# Patient Record
Sex: Male | Born: 1955
Health system: Southern US, Community
[De-identification: ages and names within clinical notes are randomized; demographics above are authoritative.]

## PROBLEM LIST (undated history)

## (undated) DIAGNOSIS — E785 Hyperlipidemia, unspecified: Secondary | ICD-10-CM

## (undated) DIAGNOSIS — F419 Anxiety disorder, unspecified: Secondary | ICD-10-CM

## (undated) DIAGNOSIS — R002 Palpitations: Secondary | ICD-10-CM

## (undated) DIAGNOSIS — L600 Ingrowing nail: Secondary | ICD-10-CM

## (undated) DIAGNOSIS — K645 Perianal venous thrombosis: Secondary | ICD-10-CM

## (undated) DIAGNOSIS — I420 Dilated cardiomyopathy: Secondary | ICD-10-CM

## (undated) DIAGNOSIS — I493 Ventricular premature depolarization: Secondary | ICD-10-CM

## (undated) DIAGNOSIS — R011 Cardiac murmur, unspecified: Secondary | ICD-10-CM

## (undated) DIAGNOSIS — I340 Nonrheumatic mitral (valve) insufficiency: Secondary | ICD-10-CM

## (undated) DIAGNOSIS — N529 Male erectile dysfunction, unspecified: Secondary | ICD-10-CM

## (undated) DIAGNOSIS — I429 Cardiomyopathy, unspecified: Secondary | ICD-10-CM

## (undated) DIAGNOSIS — L603 Nail dystrophy: Secondary | ICD-10-CM

## (undated) DIAGNOSIS — I10 Essential (primary) hypertension: Secondary | ICD-10-CM

## (undated) DIAGNOSIS — F410 Panic disorder [episodic paroxysmal anxiety] without agoraphobia: Secondary | ICD-10-CM

## (undated) DIAGNOSIS — E291 Testicular hypofunction: Secondary | ICD-10-CM

## (undated) DIAGNOSIS — G2581 Restless legs syndrome: Secondary | ICD-10-CM

## (undated) HISTORY — DX: Nonrheumatic mitral (valve) insufficiency: I34.0

## (undated) HISTORY — DX: Ventricular premature depolarization: I49.3

## (undated) HISTORY — DX: Nail dystrophy: L60.3

## (undated) HISTORY — DX: Cardiac murmur, unspecified: R01.1

## (undated) HISTORY — DX: Testicular hypofunction: E29.1

## (undated) HISTORY — DX: Restless legs syndrome: G25.81

## (undated) HISTORY — DX: Ingrowing nail: L60.0

## (undated) HISTORY — DX: Cardiomyopathy, unspecified: I42.9

## (undated) HISTORY — PX: COLONOSCOPY: SHX174

## (undated) HISTORY — PX: POLYPECTOMY: SHX149

## (undated) HISTORY — DX: Panic disorder (episodic paroxysmal anxiety): F41.0

## (undated) HISTORY — DX: Hyperlipidemia, unspecified: E78.5

## (undated) HISTORY — PX: HERNIA REPAIR: SHX51

## (undated) HISTORY — DX: Palpitations: R00.2

## (undated) HISTORY — DX: Male erectile dysfunction, unspecified: N52.9

## (undated) HISTORY — DX: Anxiety disorder, unspecified: F41.9

## (undated) HISTORY — DX: Dilated cardiomyopathy: I42.0

## (undated) HISTORY — DX: Essential (primary) hypertension: I10

## (undated) HISTORY — DX: Perianal venous thrombosis: K64.5

---

## 2004-02-29 ENCOUNTER — Emergency Department (HOSPITAL_COMMUNITY): Admission: EM | Admit: 2004-02-29 | Discharge: 2004-02-29 | Payer: Self-pay | Admitting: Emergency Medicine

## 2005-08-06 ENCOUNTER — Ambulatory Visit: Payer: Self-pay | Admitting: Oncology

## 2005-10-29 ENCOUNTER — Ambulatory Visit: Payer: Self-pay | Admitting: Oncology

## 2006-01-21 ENCOUNTER — Ambulatory Visit: Payer: Self-pay | Admitting: Oncology

## 2006-07-15 ENCOUNTER — Ambulatory Visit: Payer: Self-pay | Admitting: Oncology

## 2012-10-26 ENCOUNTER — Emergency Department (HOSPITAL_COMMUNITY): Payer: Managed Care, Other (non HMO)

## 2012-10-26 ENCOUNTER — Encounter (HOSPITAL_COMMUNITY): Payer: Self-pay | Admitting: Emergency Medicine

## 2012-10-26 ENCOUNTER — Emergency Department (HOSPITAL_COMMUNITY)
Admission: EM | Admit: 2012-10-26 | Discharge: 2012-10-27 | Disposition: A | Discharge status: Home / Self Care | Payer: Managed Care, Other (non HMO) | Attending: Emergency Medicine | Admitting: Emergency Medicine

## 2012-10-26 DIAGNOSIS — R066 Hiccough: Secondary | ICD-10-CM

## 2012-10-26 DIAGNOSIS — Z79899 Other long term (current) drug therapy: Secondary | ICD-10-CM | POA: Insufficient documentation

## 2012-10-26 DIAGNOSIS — E785 Hyperlipidemia, unspecified: Secondary | ICD-10-CM | POA: Insufficient documentation

## 2012-10-26 DIAGNOSIS — J3489 Other specified disorders of nose and nasal sinuses: Secondary | ICD-10-CM | POA: Insufficient documentation

## 2012-10-26 DIAGNOSIS — Z792 Long term (current) use of antibiotics: Secondary | ICD-10-CM | POA: Insufficient documentation

## 2012-10-26 DIAGNOSIS — R0602 Shortness of breath: Secondary | ICD-10-CM | POA: Insufficient documentation

## 2012-10-26 HISTORY — DX: Hyperlipidemia, unspecified: E78.5

## 2012-10-26 MED ORDER — METOCLOPRAMIDE HCL 5 MG/ML IJ SOLN
10.0000 mg | Freq: Once | INTRAMUSCULAR | Status: AC
  Administered 2012-10-27: 10 mg via INTRAVENOUS
  Filled 2012-10-26: qty 2

## 2012-10-26 MED ORDER — GI COCKTAIL ~~LOC~~
30.0000 mL | Freq: Once | ORAL | Status: AC
  Administered 2012-10-26: 30 mL via ORAL
  Filled 2012-10-26: qty 30

## 2012-10-26 MED ORDER — BACLOFEN 10 MG PO TABS
10.0000 mg | ORAL_TABLET | ORAL | Status: AC
  Administered 2012-10-26: 10 mg via ORAL
  Filled 2012-10-26: qty 1

## 2012-10-26 MED ORDER — LORAZEPAM 2 MG/ML IJ SOLN
1.0000 mg | Freq: Once | INTRAMUSCULAR | Status: AC
  Administered 2012-10-27: 1 mg via INTRAVENOUS
  Filled 2012-10-26: qty 1

## 2012-10-26 NOTE — ED Provider Notes (Signed)
CSN: [id number]     Arrival date & time 10/26/12  2011 History   First MD Initiated Contact with Patient 10/26/12 2126     Chief Complaint  Patient presents with  . Hiccups   (Consider location/radiation/quality/duration/timing/severity/associated sxs/prior Treatment) HPI Comments: Spontaneously began having hiccups  Patient is a 57 y.o. male presenting with general illness. The history is provided by the patient.  Illness Location:  Home Quality:  Severe Severity:  Severe Onset quality:  Sudden Duration:  5 days Timing:  Constant Progression:  Worsening Context:  Spontaneously Relieved by:  Nothing Worsened by:  Nothing Ineffective treatments:  Thorazine Associated symptoms: congestion and shortness of breath (with severe hiccups)   Associated symptoms: no cough, no fever and no vomiting     Past Medical History  Diagnosis Date  . Hyperlipemia    History reviewed. No pertinent past surgical history. No family history on file. History  Substance Use Topics  . Smoking status: Not on file  . Smokeless tobacco: Not on file  . Alcohol Use: Yes    Review of Systems  Constitutional: Negative for fever.  HENT: Positive for congestion.   Respiratory: Positive for shortness of breath (with severe hiccups). Negative for cough.   Gastrointestinal: Negative for vomiting.  All other systems reviewed and are negative.    Allergies  Shellfish allergy  Home Medications   Current Outpatient Rx  Name  Route  Sig  Dispense  Refill  . Alum & Mag Hydroxide-Simeth (MAGIC MOUTHWASH) SOLN   Oral   Take 5 mLs by mouth 4 (four) times daily as needed (for sore throat).         Marland Kitchen amoxicillin (AMOXIL) 500 MG capsule   Oral   Take 500 mg by mouth 3 (three) times daily.          . promethazine-codeine (PHENERGAN WITH CODEINE) 6.25-10 MG/5ML syrup   Oral   Take 5 mLs by mouth every 6 (six) hours as needed for cough.          . simvastatin (ZOCOR) 20 MG tablet   Oral  Take 20 mg by mouth every evening.         . testosterone (ANDROGEL) 50 MG/5GM GEL   Transdermal   Place 5 g onto the skin daily.          BP 138/81  Pulse 81  Temp(Src) 97.3 F (36.3 C) (Oral)  Resp 18  SpO2 98% Physical Exam  Nursing note and vitals reviewed. Constitutional: He is oriented to person, place, and time. He appears well-developed and well-nourished. No distress.  HENT:  Head: Normocephalic and atraumatic.  Mouth/Throat: No oropharyngeal exudate.  Eyes: EOM are normal. Pupils are equal, round, and reactive to light.  Neck: Normal range of motion. Neck supple.  Cardiovascular: Normal rate and regular rhythm.  Exam reveals no friction rub.   No murmur heard. Pulmonary/Chest: Effort normal and breath sounds normal. No respiratory distress. He has no wheezes. He has no rales.  Abdominal: He exhibits no distension. There is no tenderness. There is no rebound.  Musculoskeletal: Normal range of motion. He exhibits no edema.  Neurological: He is alert and oriented to person, place, and time. No cranial nerve deficit. He exhibits normal muscle tone. Coordination normal.  Hiccupping frequently with multiple hiccups in a row causing difficulty breathing  Skin: No rash noted. He is not diaphoretic.    ED Course  Procedures (including critical care time) Labs Review Labs Reviewed  CBC  BASIC METABOLIC  PANEL   Imaging Review No results found.  EKG Interpretation   None       MDM   1. Hiccups    Intractable hiccups for past 5 days. Took thorazine prescribed by his doctor today without relief. Baclofen given here. Baclofen with transient relief. Will check labs, CXR, give IV reglan and benzos.  Labs with mild hyponatremia, no other abnormalities. After reglan and ativan, sleeping comfortably. Hiccups resolved. Given baclofen to go home.   Dagmar Hait, MD 10/27/12 [phone]

## 2012-10-26 NOTE — ED Notes (Signed)
Per Patient: Patient states he has had constant hiccups for 5-6 days. Pt reports he has been prescribed Thorazine by his pcp without relief. States he first took the Thorazine around 1700-1800 today without relief. Pt is Ax4, NAD. Reports he is currently finishing up antibiotics for URI.

## 2012-10-26 NOTE — ED Notes (Signed)
Pt c/o hiccups for the past 5 days

## 2012-10-27 LAB — BASIC METABOLIC PANEL
BUN: 11 mg/dL (ref 6–23)
CO2: 30 mEq/L (ref 19–32)
Calcium: 9.2 mg/dL (ref 8.4–10.5)
Chloride: 92 mEq/L — ABNORMAL LOW (ref 96–112)
GFR calc Af Amer: >90 mL/min (ref >90)
GFR calc non Af Amer: >90 mL/min (ref >90)
Glucose, Bld: 132 mg/dL — ABNORMAL HIGH (ref 70–99)

## 2012-10-27 LAB — CBC
HCT: 45.9 % (ref 39.0–52.0)
RBC: 4.93 MIL/uL (ref 4.22–5.81)
RDW: 12.2 % (ref 11.5–15.5)

## 2012-10-27 MED ORDER — BACLOFEN 10 MG PO TABS: 10.0000 mg | ORAL_TABLET | Freq: Three times a day (TID) | ORAL | Status: DC | PRN

## 2014-09-26 IMAGING — CR DG CHEST 2V
2 series · 2 of 2 positions shown · non-contrast
Comparison: 02/29/2004

CLINICAL DATA: Hiccups

EXAM:
CHEST  2 VIEW

[w chest pa]
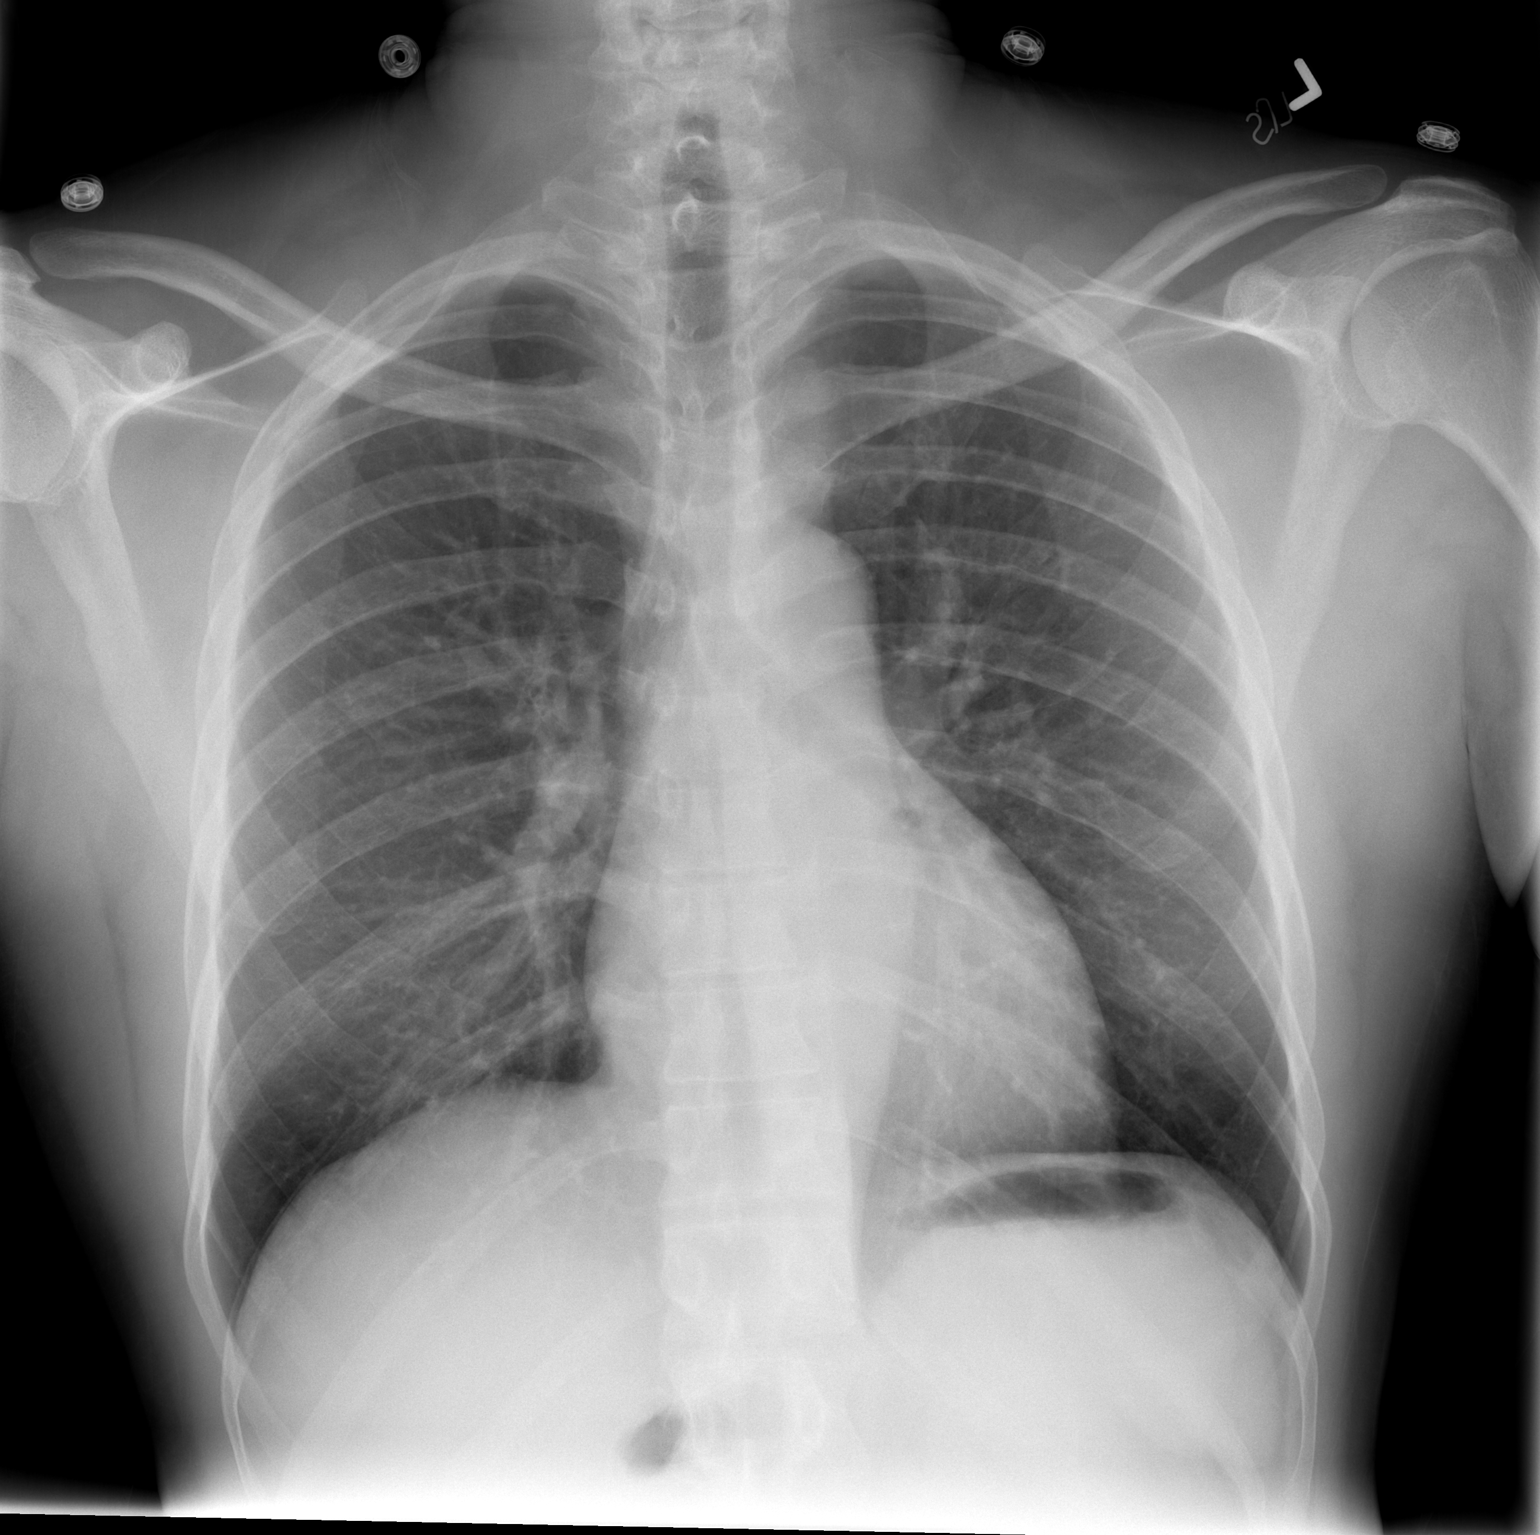

[w chest lat]
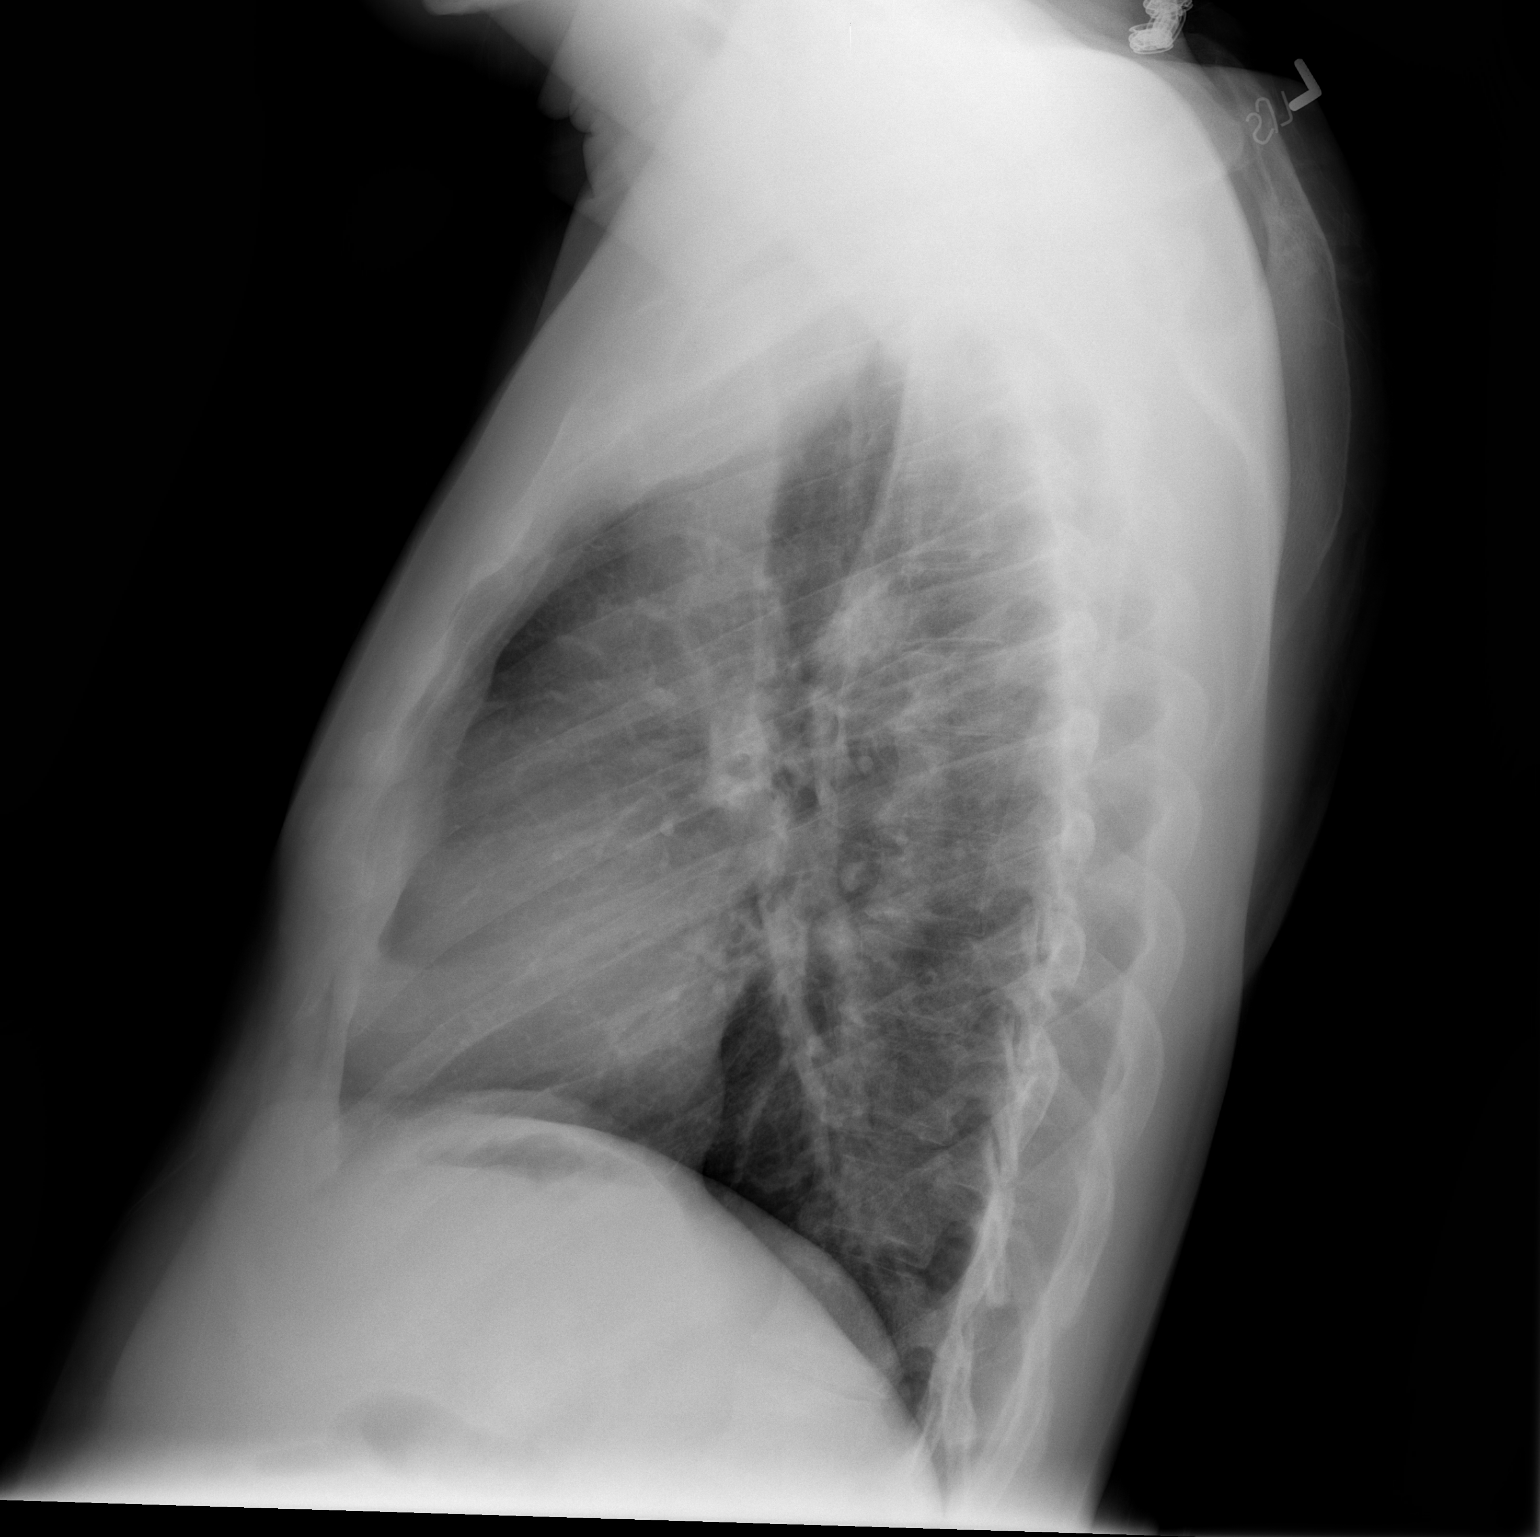

[2 of 2 positions shown; findings below may reference images not displayed]

FINDINGS: The heart size and mediastinal contours are within normal limits.
Both lungs are clear. The visualized skeletal structures are
unremarkable.
IMPRESSION: No active cardiopulmonary disease.

## 2015-11-10 DIAGNOSIS — I5042 Chronic combined systolic (congestive) and diastolic (congestive) heart failure: Secondary | ICD-10-CM | POA: Diagnosis not present

## 2015-11-10 DIAGNOSIS — I420 Dilated cardiomyopathy: Secondary | ICD-10-CM | POA: Diagnosis not present

## 2015-11-10 DIAGNOSIS — Z6823 Body mass index (BMI) 23.0-23.9, adult: Secondary | ICD-10-CM | POA: Diagnosis not present

## 2015-11-10 DIAGNOSIS — R0609 Other forms of dyspnea: Secondary | ICD-10-CM | POA: Diagnosis not present

## 2015-12-13 DIAGNOSIS — I420 Dilated cardiomyopathy: Secondary | ICD-10-CM | POA: Diagnosis not present

## 2015-12-13 DIAGNOSIS — E349 Endocrine disorder, unspecified: Secondary | ICD-10-CM | POA: Diagnosis not present

## 2015-12-13 DIAGNOSIS — Z23 Encounter for immunization: Secondary | ICD-10-CM | POA: Diagnosis not present

## 2015-12-13 DIAGNOSIS — E785 Hyperlipidemia, unspecified: Secondary | ICD-10-CM | POA: Diagnosis not present

## 2015-12-13 DIAGNOSIS — R0600 Dyspnea, unspecified: Secondary | ICD-10-CM | POA: Diagnosis not present

## 2015-12-13 DIAGNOSIS — Z0000 Encounter for general adult medical examination without abnormal findings: Secondary | ICD-10-CM | POA: Diagnosis not present

## 2015-12-31 DIAGNOSIS — L600 Ingrowing nail: Secondary | ICD-10-CM

## 2015-12-31 DIAGNOSIS — L603 Nail dystrophy: Secondary | ICD-10-CM

## 2015-12-31 HISTORY — DX: Nail dystrophy: L60.3

## 2015-12-31 HISTORY — DX: Ingrowing nail: L60.0

## 2016-01-26 DIAGNOSIS — J019 Acute sinusitis, unspecified: Secondary | ICD-10-CM | POA: Diagnosis not present

## 2016-01-26 DIAGNOSIS — Z6824 Body mass index (BMI) 24.0-24.9, adult: Secondary | ICD-10-CM | POA: Diagnosis not present

## 2016-02-09 DIAGNOSIS — J019 Acute sinusitis, unspecified: Secondary | ICD-10-CM | POA: Diagnosis not present

## 2016-02-09 DIAGNOSIS — R509 Fever, unspecified: Secondary | ICD-10-CM | POA: Diagnosis not present

## 2016-02-19 DIAGNOSIS — J019 Acute sinusitis, unspecified: Secondary | ICD-10-CM | POA: Diagnosis not present

## 2016-02-19 DIAGNOSIS — Z6823 Body mass index (BMI) 23.0-23.9, adult: Secondary | ICD-10-CM | POA: Diagnosis not present

## 2016-02-19 DIAGNOSIS — K1120 Sialoadenitis, unspecified: Secondary | ICD-10-CM | POA: Diagnosis not present

## 2016-03-13 DIAGNOSIS — E349 Endocrine disorder, unspecified: Secondary | ICD-10-CM | POA: Diagnosis not present

## 2016-03-13 DIAGNOSIS — E785 Hyperlipidemia, unspecified: Secondary | ICD-10-CM | POA: Diagnosis not present

## 2016-03-13 DIAGNOSIS — I420 Dilated cardiomyopathy: Secondary | ICD-10-CM | POA: Diagnosis not present

## 2016-03-13 DIAGNOSIS — N529 Male erectile dysfunction, unspecified: Secondary | ICD-10-CM | POA: Diagnosis not present

## 2016-06-19 DIAGNOSIS — E291 Testicular hypofunction: Secondary | ICD-10-CM | POA: Diagnosis not present

## 2016-06-19 DIAGNOSIS — I420 Dilated cardiomyopathy: Secondary | ICD-10-CM | POA: Diagnosis not present

## 2016-06-19 DIAGNOSIS — N529 Male erectile dysfunction, unspecified: Secondary | ICD-10-CM | POA: Diagnosis not present

## 2016-06-19 DIAGNOSIS — E785 Hyperlipidemia, unspecified: Secondary | ICD-10-CM | POA: Diagnosis not present

## 2016-06-19 DIAGNOSIS — Z1389 Encounter for screening for other disorder: Secondary | ICD-10-CM | POA: Diagnosis not present

## 2016-06-28 DIAGNOSIS — J019 Acute sinusitis, unspecified: Secondary | ICD-10-CM | POA: Diagnosis not present

## 2016-06-28 DIAGNOSIS — Z6824 Body mass index (BMI) 24.0-24.9, adult: Secondary | ICD-10-CM | POA: Diagnosis not present

## 2016-09-25 DIAGNOSIS — Z1389 Encounter for screening for other disorder: Secondary | ICD-10-CM | POA: Diagnosis not present

## 2016-09-25 DIAGNOSIS — I420 Dilated cardiomyopathy: Secondary | ICD-10-CM | POA: Diagnosis not present

## 2016-09-25 DIAGNOSIS — N529 Male erectile dysfunction, unspecified: Secondary | ICD-10-CM | POA: Diagnosis not present

## 2016-09-25 DIAGNOSIS — E291 Testicular hypofunction: Secondary | ICD-10-CM | POA: Diagnosis not present

## 2016-09-25 DIAGNOSIS — E785 Hyperlipidemia, unspecified: Secondary | ICD-10-CM | POA: Diagnosis not present

## 2016-09-28 DIAGNOSIS — I429 Cardiomyopathy, unspecified: Secondary | ICD-10-CM

## 2016-09-28 DIAGNOSIS — I420 Dilated cardiomyopathy: Secondary | ICD-10-CM | POA: Insufficient documentation

## 2016-09-28 HISTORY — DX: Dilated cardiomyopathy: I42.0

## 2016-09-28 HISTORY — DX: Cardiomyopathy, unspecified: I42.9

## 2016-09-30 DIAGNOSIS — E785 Hyperlipidemia, unspecified: Secondary | ICD-10-CM

## 2016-09-30 DIAGNOSIS — I429 Cardiomyopathy, unspecified: Secondary | ICD-10-CM | POA: Diagnosis not present

## 2016-09-30 HISTORY — DX: Hyperlipidemia, unspecified: E78.5

## 2016-10-18 DIAGNOSIS — Z6823 Body mass index (BMI) 23.0-23.9, adult: Secondary | ICD-10-CM | POA: Diagnosis not present

## 2016-10-18 DIAGNOSIS — J019 Acute sinusitis, unspecified: Secondary | ICD-10-CM | POA: Diagnosis not present

## 2016-11-17 DIAGNOSIS — I429 Cardiomyopathy, unspecified: Secondary | ICD-10-CM | POA: Diagnosis not present

## 2016-12-18 DIAGNOSIS — I420 Dilated cardiomyopathy: Secondary | ICD-10-CM | POA: Diagnosis not present

## 2016-12-18 DIAGNOSIS — E291 Testicular hypofunction: Secondary | ICD-10-CM | POA: Diagnosis not present

## 2016-12-18 DIAGNOSIS — E785 Hyperlipidemia, unspecified: Secondary | ICD-10-CM | POA: Diagnosis not present

## 2016-12-18 DIAGNOSIS — Z23 Encounter for immunization: Secondary | ICD-10-CM | POA: Diagnosis not present

## 2016-12-18 DIAGNOSIS — Z0000 Encounter for general adult medical examination without abnormal findings: Secondary | ICD-10-CM | POA: Diagnosis not present

## 2017-01-10 DIAGNOSIS — I10 Essential (primary) hypertension: Secondary | ICD-10-CM | POA: Diagnosis not present

## 2017-01-10 DIAGNOSIS — R51 Headache: Secondary | ICD-10-CM | POA: Diagnosis not present

## 2017-01-22 DIAGNOSIS — I10 Essential (primary) hypertension: Secondary | ICD-10-CM | POA: Diagnosis not present

## 2017-01-22 DIAGNOSIS — R0981 Nasal congestion: Secondary | ICD-10-CM | POA: Diagnosis not present

## 2017-02-21 DIAGNOSIS — J019 Acute sinusitis, unspecified: Secondary | ICD-10-CM | POA: Diagnosis not present

## 2017-03-11 DIAGNOSIS — I493 Ventricular premature depolarization: Secondary | ICD-10-CM

## 2017-03-11 DIAGNOSIS — G2581 Restless legs syndrome: Secondary | ICD-10-CM | POA: Insufficient documentation

## 2017-03-11 DIAGNOSIS — F410 Panic disorder [episodic paroxysmal anxiety] without agoraphobia: Secondary | ICD-10-CM

## 2017-03-11 DIAGNOSIS — R002 Palpitations: Secondary | ICD-10-CM

## 2017-03-11 DIAGNOSIS — I340 Nonrheumatic mitral (valve) insufficiency: Secondary | ICD-10-CM

## 2017-03-11 DIAGNOSIS — E291 Testicular hypofunction: Secondary | ICD-10-CM

## 2017-03-11 DIAGNOSIS — F419 Anxiety disorder, unspecified: Secondary | ICD-10-CM

## 2017-03-11 DIAGNOSIS — I10 Essential (primary) hypertension: Secondary | ICD-10-CM

## 2017-03-11 DIAGNOSIS — I110 Hypertensive heart disease with heart failure: Secondary | ICD-10-CM | POA: Insufficient documentation

## 2017-03-11 DIAGNOSIS — K645 Perianal venous thrombosis: Secondary | ICD-10-CM

## 2017-03-11 DIAGNOSIS — N529 Male erectile dysfunction, unspecified: Secondary | ICD-10-CM | POA: Insufficient documentation

## 2017-03-11 DIAGNOSIS — R011 Cardiac murmur, unspecified: Secondary | ICD-10-CM

## 2017-03-11 HISTORY — DX: Testicular hypofunction: E29.1

## 2017-03-11 HISTORY — DX: Male erectile dysfunction, unspecified: N52.9

## 2017-03-11 HISTORY — DX: Anxiety disorder, unspecified: F41.9

## 2017-03-11 HISTORY — DX: Nonrheumatic mitral (valve) insufficiency: I34.0

## 2017-03-11 HISTORY — DX: Restless legs syndrome: G25.81

## 2017-03-11 HISTORY — DX: Perianal venous thrombosis: K64.5

## 2017-03-11 HISTORY — DX: Panic disorder (episodic paroxysmal anxiety): F41.0

## 2017-03-11 HISTORY — DX: Palpitations: R00.2

## 2017-03-11 HISTORY — DX: Essential (primary) hypertension: I10

## 2017-03-11 HISTORY — DX: Cardiac murmur, unspecified: R01.1

## 2017-03-11 HISTORY — DX: Ventricular premature depolarization: I49.3

## 2017-03-16 DIAGNOSIS — I5042 Chronic combined systolic (congestive) and diastolic (congestive) heart failure: Secondary | ICD-10-CM | POA: Insufficient documentation

## 2017-03-16 NOTE — Progress Notes (Signed)
Cardiology Office Note:    Date:  03/17/2017   ID:  Bobby Ellis, DOB [birth year], MRN [id number]  PCP:  Nicoletta Dress, MD  Cardiologist:  Shirlee More, MD    Referring MD: Nicoletta Dress, MD    ASSESSMENT:    1. Chronic combined systolic and diastolic heart failure ([place])   2. Hypertensive heart disease with heart failure ([place])   3. PVC (premature ventricular contraction)   4. Mild mitral regurgitation   5. Hyperlipidemia, unspecified hyperlipidemia type    PLAN:    In order of problems listed above:  1. Stable compensated at this time does not require loop diuretic he alerts me at echocardiogram at my previous practice in September was told was normal and I will attempt to access the #report.  Unless his ejection fraction is less than 40 I would not consider transition to [place]. 2. Recently blood pressures been above target improved with calcium channel blocker and I will switch from ACE inhibitor to a high intensity ARB for better blood pressure control goal systolic of 628-315 continue his carvedilol.  3.  Stable asymptomatic continue beta-blocker 4. Stable by physical exam await echocardiogram 5. Stable continue with statin     Next appointment: 6 months   Medication Adjustments/Labs and Tests Ordered: Current medicines are reviewed at length with the patient today.  Concerns regarding medicines are outlined above.  Orders Placed This Encounter  Procedures  . EKG 12-Lead   Meds ordered this encounter  Medications  . candesartan (ATACAND) 32 MG tablet    Sig: Take 1 tablet (32 mg total) by mouth daily.    Dispense:  90 tablet    Refill:  3    Chief Complaint  Patient presents with  . Follow-up    former pt of Dr Jimmie Molly, last seen in 2017  . Congestive Heart Failure  . Hypertension    History of Present Illness:    Bobby Ellis is a 62 y.o. male with a hx of dilated cardiomyopathy with heart failure, hypertension, PVC's and mild mitral  regurgitation previously cared for by dr Jimmie Molly at [facility]  last seen there 09/30/16. His last documented EF 55% previously described as 45% Compliance with diet, lifestyle and medications: Yes Recently his systolic blood pressure is elevated he is better with the addition of a calcium channel blocker.  No chest pain shortness of breath palpitation or syncope.  He no longer requires a loop diuretic.  Echocardiogram was performed in my old practice in September he was verbally told it was normal report requested. Past Medical History:  Diagnosis Date  . Anxiety 03/11/2017  . Benign hypertension 03/11/2017  . Cardiomyopathy ([place]) 09/28/2016  . Dilated cardiomyopathy ([place]) 09/28/2016  . Erectile dysfunction 03/11/2017  . External hemorrhoid, thrombosed 03/11/2017  . Heart murmur 03/11/2017  . Hyperlipemia   . Hyperlipidemia 09/30/2016  . Hypogonadism in male 03/11/2017  . Ingrowing nail 12/31/2015  . Mild mitral regurgitation 03/11/2017  . Nail dystrophy 12/31/2015  . Palpitations 03/11/2017  . Panic disorder 03/11/2017  . PVC (premature ventricular contraction) 03/11/2017  . RLS (restless legs syndrome) 03/11/2017    Past Surgical History:  Procedure Laterality Date  . HERNIA REPAIR      Current Medications: Current Meds  Medication Sig  . amLODipine (NORVASC) 5 MG tablet Take 5 mg by mouth daily.  . carvedilol (COREG) 12.5 MG tablet TAKE 1 TABLET TWICE A DAY  . simvastatin (ZOCOR) 20 MG tablet Take 20 mg by mouth every  evening.  . testosterone (ANDROGEL) 50 MG/5GM GEL Place 5 g onto the skin daily.  . [DISCONTINUED] lisinopril (PRINIVIL,ZESTRIL) 40 MG tablet Take 40 mg by mouth 2 (two) times daily.     Allergies:   Shellfish allergy   Social History   Socioeconomic History  . Marital status: Single    Spouse name: None  . Number of children: None  . Years of education: None  . Highest education level: None  Social Needs  . Financial resource strain: None  . Food insecurity - worry:  None  . Food insecurity - inability: None  . Transportation needs - medical: None  . Transportation needs - non-medical: None  Occupational History  . None  Tobacco Use  . Smoking status: Never Smoker  . Smokeless tobacco: Never Used  Substance and Sexual Activity  . Alcohol use: Yes    Comment: socially  . Drug use: No  . Sexual activity: None  Other Topics Concern  . None  Social History Narrative  . None     Family History: The patient's family history includes Colon polyps in his brother and father; Pancreatic cancer in his mother. ROS:   Please see the history of present illness.    All other systems reviewed and are negative.  EKGs/Labs/Other Studies Reviewed:    The following studies were reviewed today:  Echo performed at [facility] 11/17/16 requested  Recent Labs:12/15/16: CMP normal cr 0.98 K 4.3, Cbc and TSH normal, Chol 151, HDL 53, LDL 80 No results found for requested labs within last 8760 hours.  Recent Lipid Panel No results found for: CHOL, TRIG, HDL, CHOLHDL, VLDL, LDLCALC, LDLDIRECT  Physical Exam:    VS:  BP 136/84 (BP Location: Right Arm, Patient Position: Sitting, Cuff Size: Normal)   Pulse 64   Ht 6' (1.829 m)   Wt 170 lb (77.1 kg)   SpO2 98%   BMI 23.06 kg/m     Wt Readings from Last 3 Encounters:  03/17/17 170 lb (77.1 kg)     GEN:  Well nourished, well developed in no acute distress HEENT: Normal NECK: No JVD; No carotid bruits LYMPHATICS: No lymphadenopathy CARDIAC: RRR, no murmurs, rubs, gallops RESPIRATORY:  Clear to auscultation without rales, wheezing or rhonchi  ABDOMEN: Soft, non-tender, non-distended MUSCULOSKELETAL:  No edema; No deformity  SKIN: Warm and dry NEUROLOGIC:  Alert and oriented x 3 PSYCHIATRIC:  Normal affect    Signed, Shirlee More, MD  03/17/2017 5:15 PM    [facility]

## 2017-03-17 ENCOUNTER — Ambulatory Visit (INDEPENDENT_AMBULATORY_CARE_PROVIDER_SITE_OTHER): Payer: BLUE CROSS/BLUE SHIELD | Admitting: Cardiology

## 2017-03-17 ENCOUNTER — Encounter: Payer: Self-pay | Admitting: Cardiology

## 2017-03-17 VITALS — BP 136/84 | HR 64 | Ht 72.0 in | Wt 170.0 lb

## 2017-03-17 DIAGNOSIS — I340 Nonrheumatic mitral (valve) insufficiency: Secondary | ICD-10-CM

## 2017-03-17 DIAGNOSIS — E785 Hyperlipidemia, unspecified: Secondary | ICD-10-CM | POA: Diagnosis not present

## 2017-03-17 DIAGNOSIS — I5042 Chronic combined systolic (congestive) and diastolic (congestive) heart failure: Secondary | ICD-10-CM

## 2017-03-17 DIAGNOSIS — I110 Hypertensive heart disease with heart failure: Secondary | ICD-10-CM | POA: Diagnosis not present

## 2017-03-17 DIAGNOSIS — I493 Ventricular premature depolarization: Secondary | ICD-10-CM | POA: Diagnosis not present

## 2017-03-17 MED ORDER — CANDESARTAN CILEXETIL 32 MG PO TABS: 32.0000 mg | ORAL_TABLET | Freq: Every day | ORAL | 3 refills | Status: DC

## 2017-03-17 NOTE — Patient Instructions (Addendum)
Medication Instructions:  Your physician has recommended you make the following change in your medication:  STOP lisinopril START candesartan 32 mg daily   Labwork: None  Testing/Procedures: None  Follow-Up: Your physician wants you to follow-up in: 6 months. You will receive a reminder letter in the mail two months in advance. If you don't receive a letter, please call our office to schedule the follow-up appointment.   Any Other Special Instructions Will Be Listed Below (If Applicable).     If you need a refill on your cardiac medications before your next appointment, please call your pharmacy.

## 2017-03-19 DIAGNOSIS — I420 Dilated cardiomyopathy: Secondary | ICD-10-CM | POA: Diagnosis not present

## 2017-03-19 DIAGNOSIS — N529 Male erectile dysfunction, unspecified: Secondary | ICD-10-CM | POA: Diagnosis not present

## 2017-03-19 DIAGNOSIS — E785 Hyperlipidemia, unspecified: Secondary | ICD-10-CM | POA: Diagnosis not present

## 2017-03-19 DIAGNOSIS — E291 Testicular hypofunction: Secondary | ICD-10-CM | POA: Diagnosis not present

## 2017-06-25 DIAGNOSIS — E291 Testicular hypofunction: Secondary | ICD-10-CM | POA: Diagnosis not present

## 2017-06-25 DIAGNOSIS — E785 Hyperlipidemia, unspecified: Secondary | ICD-10-CM | POA: Diagnosis not present

## 2017-06-25 DIAGNOSIS — N529 Male erectile dysfunction, unspecified: Secondary | ICD-10-CM | POA: Diagnosis not present

## 2017-06-25 DIAGNOSIS — I420 Dilated cardiomyopathy: Secondary | ICD-10-CM | POA: Diagnosis not present

## 2017-10-01 DIAGNOSIS — E291 Testicular hypofunction: Secondary | ICD-10-CM | POA: Diagnosis not present

## 2017-10-01 DIAGNOSIS — E785 Hyperlipidemia, unspecified: Secondary | ICD-10-CM | POA: Diagnosis not present

## 2017-10-01 DIAGNOSIS — Z79890 Hormone replacement therapy (postmenopausal): Secondary | ICD-10-CM | POA: Diagnosis not present

## 2017-10-01 DIAGNOSIS — N529 Male erectile dysfunction, unspecified: Secondary | ICD-10-CM | POA: Diagnosis not present

## 2017-10-01 DIAGNOSIS — I420 Dilated cardiomyopathy: Secondary | ICD-10-CM | POA: Diagnosis not present

## 2017-12-07 ENCOUNTER — Encounter: Payer: Self-pay | Admitting: Cardiology

## 2017-12-07 ENCOUNTER — Ambulatory Visit (INDEPENDENT_AMBULATORY_CARE_PROVIDER_SITE_OTHER): Payer: BLUE CROSS/BLUE SHIELD | Admitting: Cardiology

## 2017-12-07 VITALS — BP 132/82 | HR 66 | Ht 72.0 in | Wt 177.2 lb

## 2017-12-07 DIAGNOSIS — E785 Hyperlipidemia, unspecified: Secondary | ICD-10-CM | POA: Diagnosis not present

## 2017-12-07 DIAGNOSIS — I493 Ventricular premature depolarization: Secondary | ICD-10-CM

## 2017-12-07 DIAGNOSIS — I420 Dilated cardiomyopathy: Secondary | ICD-10-CM | POA: Diagnosis not present

## 2017-12-07 DIAGNOSIS — J019 Acute sinusitis, unspecified: Secondary | ICD-10-CM | POA: Diagnosis not present

## 2017-12-07 DIAGNOSIS — I110 Hypertensive heart disease with heart failure: Secondary | ICD-10-CM | POA: Diagnosis not present

## 2017-12-07 NOTE — Progress Notes (Signed)
Cardiology Office Note:    Date:  12/07/2017   ID:  Bobby Ellis, DOB 03/27/1955, MRN [id number]  PCP:  Nicoletta Dress, MD  Cardiologist:  Shirlee More, MD    Referring MD: Nicoletta Dress, MD    ASSESSMENT:    1. Dilated cardiomyopathy ([place])   2. Hypertensive heart disease with heart failure ([place])   3. PVC (premature ventricular contraction)   4. Hyperlipidemia, unspecified hyperlipidemia type    PLAN:    In order of problems listed above:  1. Improved his last ejection fraction November 2018 was normal we will recheck this year and if it is preserved he can wait 2 or 3 years for another echocardiogram.  He is on appropriate guideline directed therapy and will continue his beta-blocker ARB. 2. Improved no edema no longer requires a diuretic continue current antihypertensives beta-blocker and ARB 3. Improved minimally symptomatic continue beta-blocker 4. Stable continue statin recent lipid profile was on target cholesterol 116 HDL 35 LDL 67 normal liver and kidney function.   Next appointment: 1 year   Medication Adjustments/Labs and Tests Ordered: Current medicines are reviewed at length with the patient today.  Concerns regarding medicines are outlined above.  No orders of the defined types were placed in this encounter.  No orders of the defined types were placed in this encounter.   Chief Complaint  Patient presents with  . Follow-up  . Congestive Heart Failure    History of Present Illness:    Bobby Ellis is a 62 y.o. male with a hx of dilated cardiomyopathy with heart failure, hypertension, PVC's and mild mitral regurgitation previously cared for by dr Jimmie Molly at [facility]  last seen there 09/30/16. His last documented EF 55% previously described as 45%  He was  last seen by me 03/17/17. Compliance with diet, lifestyle and medications: Yes  His only complaint is that he just does not have the stamina he had before but he does heavy physical labor  for prolonged periods of time no shortness of breath minimal palpitation not severe sustained no syncope edema chest pain. Past Medical History:  Diagnosis Date  . Anxiety 03/11/2017  . Benign hypertension 03/11/2017  . Cardiomyopathy ([place]) 09/28/2016  . Dilated cardiomyopathy ([place]) 09/28/2016  . Erectile dysfunction 03/11/2017  . External hemorrhoid, thrombosed 03/11/2017  . Heart murmur 03/11/2017  . Hyperlipemia   . Hyperlipidemia 09/30/2016  . Hypogonadism in male 03/11/2017  . Ingrowing nail 12/31/2015  . Mild mitral regurgitation 03/11/2017  . Nail dystrophy 12/31/2015  . Palpitations 03/11/2017  . Panic disorder 03/11/2017  . PVC (premature ventricular contraction) 03/11/2017  . RLS (restless legs syndrome) 03/11/2017    Past Surgical History:  Procedure Laterality Date  . HERNIA REPAIR      Current Medications: Current Meds  Medication Sig  . ALPRAZolam (XANAX) 0.25 MG tablet Take 0.25 mg by mouth 2 (two) times daily as needed for anxiety.  Marland Kitchen amLODipine (NORVASC) 5 MG tablet Take 5 mg by mouth daily.  . candesartan (ATACAND) 32 MG tablet Take 1 tablet (32 mg total) by mouth daily.  . carvedilol (COREG) 12.5 MG tablet TAKE 1 TABLET TWICE A DAY  . simvastatin (ZOCOR) 20 MG tablet Take 20 mg by mouth every evening.  . testosterone (ANDROGEL) 50 MG/5GM GEL Place 5 g onto the skin daily.     Allergies:   Shellfish allergy   Social History   Socioeconomic History  . Marital status: Single    Spouse name: Not on file  .  Number of children: Not on file  . Years of education: Not on file  . Highest education level: Not on file  Occupational History  . Not on file  Social Needs  . Financial resource strain: Not on file  . Food insecurity:    Worry: Not on file    Inability: Not on file  . Transportation needs:    Medical: Not on file    Non-medical: Not on file  Tobacco Use  . Smoking status: Never Smoker  . Smokeless tobacco: Never Used  Substance and Sexual Activity  . Alcohol  use: Yes    Comment: socially  . Drug use: No  . Sexual activity: Not on file  Lifestyle  . Physical activity:    Days per week: Not on file    Minutes per session: Not on file  . Stress: Not on file  Relationships  . Social connections:    Talks on phone: Not on file    Gets together: Not on file    Attends religious service: Not on file    Active member of club or organization: Not on file    Attends meetings of clubs or organizations: Not on file    Relationship status: Not on file  Other Topics Concern  . Not on file  Social History Narrative  . Not on file     Family History: The patient's family history includes Colon polyps in his brother and father; Pancreatic cancer in his mother. ROS:   Please see the history of present illness.    All other systems reviewed and are negative.  EKGs/Labs/Other Studies Reviewed:    The following studies were reviewed today:   Physical Exam:    VS:  BP 132/82 (BP Location: Right Arm, Patient Position: Sitting, Cuff Size: Normal)   Pulse 66   Ht 6' (1.829 m)   Wt 177 lb 3.2 oz (80.4 kg)   SpO2 98%   BMI 24.03 kg/m     Wt Readings from Last 3 Encounters:  12/07/17 177 lb 3.2 oz (80.4 kg)  03/17/17 170 lb (77.1 kg)     GEN:  Well nourished, well developed in no acute distress HEENT: Normal NECK: No JVD; No carotid bruits LYMPHATICS: No lymphadenopathy CARDIAC: RRR, no murmurs, rubs, gallops RESPIRATORY:  Clear to auscultation without rales, wheezing or rhonchi  ABDOMEN: Soft, non-tender, non-distended MUSCULOSKELETAL:  No edema; No deformity  SKIN: Warm and dry NEUROLOGIC:  Alert and oriented x 3 PSYCHIATRIC:  Normal affect    Signed, Shirlee More, MD  12/07/2017 4:04 PM    [facility]

## 2017-12-07 NOTE — Addendum Note (Signed)
Addended by: Austin Miles on: 12/07/2017 04:12 PM   Modules accepted: Orders

## 2017-12-07 NOTE — Patient Instructions (Signed)
Medication Instructions:  Your physician recommends that you continue on your current medications as directed. Please refer to the Current Medication list given to you today.  If you need a refill on your cardiac medications before your next appointment, please call your pharmacy.   Lab work: None  If you have labs (blood work) drawn today and your tests are completely normal, you will receive your results only by: Marland Kitchen MyChart Message (if you have MyChart) OR . A paper copy in the mail If you have any lab test that is abnormal or we need to change your treatment, we will call you to review the results.  Testing/Procedures: Your physician has requested that you have an echocardiogram. Echocardiography is a painless test that uses sound waves to create images of your heart. It provides your doctor with information about the size and shape of your heart and how well your heart's chambers and valves are working. This procedure takes approximately one hour. There are no restrictions for this procedure.  Follow-Up: At [facility], you and your health needs are our priority.  As part of our continuing mission to provide you with exceptional heart care, we have created designated Provider Care Teams.  These Care Teams include your primary Cardiologist (physician) and Advanced Practice Providers (APPs -  Physician Assistants and Nurse Practitioners) who all work together to provide you with the care you need, when you need it. You will need a follow up appointment in 1 years.  Please call our office 2 months in advance to schedule this appointment.      Echocardiogram An echocardiogram, or echocardiography, uses sound waves (ultrasound) to produce an image of your heart. The echocardiogram is simple, painless, obtained within a short period of time, and offers valuable information to your health care provider. The images from an echocardiogram can provide information such as:  Evidence of coronary  artery disease (CAD).  Heart size.  Heart muscle function.  Heart valve function.  Aneurysm detection.  Evidence of a past heart attack.  Fluid buildup around the heart.  Heart muscle thickening.  Assess heart valve function.  Tell a health care provider about:  Any allergies you have.  All medicines you are taking, including vitamins, herbs, eye drops, creams, and over-the-counter medicines.  Any problems you or family members have had with anesthetic medicines.  Any blood disorders you have.  Any surgeries you have had.  Any medical conditions you have.  Whether you are pregnant or may be pregnant. What happens before the procedure? No special preparation is needed. Eat and drink normally. What happens during the procedure?  In order to produce an image of your heart, gel will be applied to your chest and a wand-like tool (transducer) will be moved over your chest. The gel will help transmit the sound waves from the transducer. The sound waves will harmlessly bounce off your heart to allow the heart images to be captured in real-time motion. These images will then be recorded.  You may need an IV to receive a medicine that improves the quality of the pictures. What happens after the procedure? You may return to your normal schedule including diet, activities, and medicines, unless your health care provider tells you otherwise. This information is not intended to replace advice given to you by your health care provider. Make sure you discuss any questions you have with your health care provider. Document Released: 12/19/1999 Document Revised: 08/09/2015 Document Reviewed: 08/28/2012 Elsevier Interactive Patient Education  2017 [employer]  Inc[employer]

## 2017-12-14 ENCOUNTER — Other Ambulatory Visit: Payer: BLUE CROSS/BLUE SHIELD

## 2017-12-24 DIAGNOSIS — E291 Testicular hypofunction: Secondary | ICD-10-CM | POA: Diagnosis not present

## 2017-12-24 DIAGNOSIS — I420 Dilated cardiomyopathy: Secondary | ICD-10-CM | POA: Diagnosis not present

## 2017-12-24 DIAGNOSIS — Z23 Encounter for immunization: Secondary | ICD-10-CM | POA: Diagnosis not present

## 2017-12-24 DIAGNOSIS — E785 Hyperlipidemia, unspecified: Secondary | ICD-10-CM | POA: Diagnosis not present

## 2017-12-24 DIAGNOSIS — Z0000 Encounter for general adult medical examination without abnormal findings: Secondary | ICD-10-CM | POA: Diagnosis not present

## 2018-01-24 ENCOUNTER — Ambulatory Visit (INDEPENDENT_AMBULATORY_CARE_PROVIDER_SITE_OTHER): Payer: BLUE CROSS/BLUE SHIELD

## 2018-01-24 DIAGNOSIS — I420 Dilated cardiomyopathy: Secondary | ICD-10-CM

## 2018-01-24 DIAGNOSIS — I110 Hypertensive heart disease with heart failure: Secondary | ICD-10-CM | POA: Diagnosis not present

## 2018-01-24 NOTE — Progress Notes (Signed)
Complete echocardiogram has been performed.  Jimmy Miryam Mcelhinney RDCS, RVT 

## 2018-02-18 DIAGNOSIS — H8113 Benign paroxysmal vertigo, bilateral: Secondary | ICD-10-CM | POA: Diagnosis not present

## 2018-02-18 DIAGNOSIS — Z6823 Body mass index (BMI) 23.0-23.9, adult: Secondary | ICD-10-CM | POA: Diagnosis not present

## 2018-02-19 ENCOUNTER — Other Ambulatory Visit: Payer: Self-pay | Admitting: Cardiology

## 2018-03-06 NOTE — Progress Notes (Signed)
Cardiology Office Note:    Date:  03/07/2018   ID:  Bobby Ellis, DOB 1955-06-15, MRN [id number]  PCP:  Bobby Dress, MD  Cardiologist:  Bobby More, MD    Referring MD: Bobby Dress, MD    ASSESSMENT:    1. Dilated cardiomyopathy ([place])   2. Chronic combined systolic and diastolic heart failure ([place])   3. Hypertensive heart disease with heart failure (HCC)   4. Chest pain in adult    PLAN:    In order of problems listed above:  1. Improved EF has normalized I would continue his carvedilol ARB but with symptomatic hypotension orthostatic symptoms and stop amlodipine. 2. Compensated does not require diuretic continue current treatment beta-blocker ARB 3. Relatively low blood pressure orthostatic symptoms stop his calcium channel blocker monitor pressure at home 4. Atypical but increased risk with cardiomyopathy 5. Myocardial perfusion study stress performed to look at heart rhythm heart rate response exercise blood pressure and assess for ejection fraction ischemia.   Next appointment: 6 weeks   Medication Adjustments/Labs and Tests Ordered: Current medicines are reviewed at length with the patient today.  Concerns regarding medicines are outlined above.  No orders of the defined types were placed in this encounter.  No orders of the defined types were placed in this encounter.   Chief Complaint  Patient presents with  . Follow-up    for PVC's and   . Cardiomyopathy  . Congestive Heart Failure  . Hypertension  . Mitral Regurgitation    History of Present Illness:    Bobby Ellis is a 63 y.o. male with a hx of dilated cardiomyopathy with heart failure, hypertension, PVC's and mild mitral regurgitation previously cared for by dr Jimmie Molly at [facility]  last seen there 09/30/16. His last documented EF 55% previously described as 45%   last seen by me  12/07/17. Compliance with diet, lifestyle and medications: Yes  Despite his dramatic improvement in  echocardiogram normal ejection fraction no mild regurgitation he does not feel well he complains of difficulty with weakness when he is upright and walking feeling faint but has not lost consciousness his resting blood pressure is relatively low standing 122/60 I think he is having symptomatic hypotension orthostatic hypotension we will stop his calcium channel blocker.  With his cardiomyopathy he will undergo stress myocardial perfusion study.  Intermittently he gets nonexertional sharp left chest pain momentary.  No palpitations associate the episodes and has not lost consciousness.  He relates recent labs with his PCP and requested were normal for blood count and kidney function   Echo 01/24/18:   Study Conclusions - Left ventricle: The cavity size was normal. Wall thickness was   normal. Systolic function was normal. The estimated ejection   fraction was in the range of 55% to 60%. Wall motion was normal;   there were no regional wall motion abnormalities. Left   ventricular diastolic function parameters were normal. - Aortic valve: There was trivial regurgitation. No MR - Ascending aorta: The ascending aorta was mildly dilated. Past Medical History:  Diagnosis Date  . Anxiety 03/11/2017  . Benign hypertension 03/11/2017  . Cardiomyopathy ([place]) 09/28/2016  . Dilated cardiomyopathy ([place]) 09/28/2016  . Erectile dysfunction 03/11/2017  . External hemorrhoid, thrombosed 03/11/2017  . Heart murmur 03/11/2017  . Hyperlipemia   . Hyperlipidemia 09/30/2016  . Hypogonadism in male 03/11/2017  . Ingrowing nail 12/31/2015  . Mild mitral regurgitation 03/11/2017  . Nail dystrophy 12/31/2015  . Palpitations 03/11/2017  . Panic  disorder 03/11/2017  . PVC (premature ventricular contraction) 03/11/2017  . RLS (restless legs syndrome) 03/11/2017    Past Surgical History:  Procedure Laterality Date  . HERNIA REPAIR      Current Medications: Current Meds  Medication Sig  . ALPRAZolam (XANAX) 0.25 MG tablet Take 0.25  mg by mouth 2 (two) times daily as needed for anxiety.  . candesartan (ATACAND) 32 MG tablet TAKE 1 TABLET DAILY  . carvedilol (COREG) 12.5 MG tablet TAKE 1 TABLET TWICE A DAY  . simvastatin (ZOCOR) 20 MG tablet Take 20 mg by mouth every evening.  . testosterone (ANDROGEL) 50 MG/5GM GEL Place 5 g onto the skin daily.  . [DISCONTINUED] amLODipine (NORVASC) 5 MG tablet Take 5 mg by mouth daily.     Allergies:   Shellfish allergy   Social History   Socioeconomic History  . Marital status: Single    Spouse name: Not on file  . Number of children: Not on file  . Years of education: Not on file  . Highest education level: Not on file  Occupational History  . Not on file  Social Needs  . Financial resource strain: Not on file  . Food insecurity:    Worry: Not on file    Inability: Not on file  . Transportation needs:    Medical: Not on file    Non-medical: Not on file  Tobacco Use  . Smoking status: Never Smoker  . Smokeless tobacco: Never Used  Substance and Sexual Activity  . Alcohol use: Yes    Comment: socially  . Drug use: No  . Sexual activity: Not on file  Lifestyle  . Physical activity:    Days per week: Not on file    Minutes per session: Not on file  . Stress: Not on file  Relationships  . Social connections:    Talks on phone: Not on file    Gets together: Not on file    Attends religious service: Not on file    Active member of club or organization: Not on file    Attends meetings of clubs or organizations: Not on file    Relationship status: Not on file  Other Topics Concern  . Not on file  Social History Narrative  . Not on file     Family History: The patient's family history includes Colon polyps in his brother and father; Pancreatic cancer in his mother. ROS:   Please see the history of present illness.    All other systems reviewed and are negative.  EKGs/Labs/Other Studies Reviewed:    The following studies were reviewed today:  EKG:  EKG  ordered today and personally reviewed.  The ekg ordered today demonstrates sinus rhythm ST-T abnormality consider inferior ischemia  Recent Labs: No results found for requested labs within last 8760 hours.  Recent Lipid Panel No results found for: CHOL, TRIG, HDL, CHOLHDL, VLDL, LDLCALC, LDLDIRECT  Physical Exam:    VS:  BP 116/76 (BP Location: Right Arm, Patient Position: Sitting, Cuff Size: Normal)   Pulse 64   Ht 6' (1.829 m)   Wt 178 lb (80.7 kg)   SpO2 98%   BMI 24.14 kg/m     Wt Readings from Last 3 Encounters:  03/07/18 178 lb (80.7 kg)  12/07/17 177 lb 3.2 oz (80.4 kg)  03/17/17 170 lb (77.1 kg)     GEN:  Well nourished, well developed in no acute distress HEENT: Normal NECK: No JVD; No carotid bruits LYMPHATICS: No lymphadenopathy  CARDIAC: RRR, no murmurs, rubs, gallops RESPIRATORY:  Clear to auscultation without rales, wheezing or rhonchi  ABDOMEN: Soft, non-tender, non-distended MUSCULOSKELETAL:  No edema; No deformity  SKIN: Warm and dry NEUROLOGIC:  Alert and oriented x 3 PSYCHIATRIC:  Normal affect    Signed, Bobby More, MD  03/07/2018 3:12 PM    [place]

## 2018-03-07 ENCOUNTER — Ambulatory Visit (INDEPENDENT_AMBULATORY_CARE_PROVIDER_SITE_OTHER): Payer: BLUE CROSS/BLUE SHIELD | Admitting: Cardiology

## 2018-03-07 ENCOUNTER — Encounter: Payer: Self-pay | Admitting: Cardiology

## 2018-03-07 ENCOUNTER — Encounter: Payer: Self-pay | Admitting: *Deleted

## 2018-03-07 VITALS — BP 116/76 | HR 64 | Ht 72.0 in | Wt 178.0 lb

## 2018-03-07 DIAGNOSIS — I5042 Chronic combined systolic (congestive) and diastolic (congestive) heart failure: Secondary | ICD-10-CM | POA: Diagnosis not present

## 2018-03-07 DIAGNOSIS — I110 Hypertensive heart disease with heart failure: Secondary | ICD-10-CM

## 2018-03-07 DIAGNOSIS — I420 Dilated cardiomyopathy: Secondary | ICD-10-CM | POA: Diagnosis not present

## 2018-03-07 DIAGNOSIS — R079 Chest pain, unspecified: Secondary | ICD-10-CM | POA: Diagnosis not present

## 2018-03-07 NOTE — Patient Instructions (Signed)
Medication Instructions:  Your physician has recommended you make the following change in your medication:   STOP amlodipine  If you need a refill on your cardiac medications before your next appointment, please call your pharmacy.   Lab work: None  If you have labs (blood work) drawn today and your tests are completely normal, you will receive your results only by: Marland Kitchen MyChart Message (if you have MyChart) OR . A paper copy in the mail If you have any lab test that is abnormal or we need to change your treatment, we will call you to review the results.  Testing/Procedures: You had an EKG today.   Your physician has requested that you have en exercise stress myoview. For further information please visit [email or web address]. Please follow instruction sheet, as given.  Follow-Up: At [facility], you and your health needs are our priority.  As part of our continuing mission to provide you with exceptional heart care, we have created designated Provider Care Teams.  These Care Teams include your primary Cardiologist (physician) and Advanced Practice Providers (APPs -  Physician Assistants and Nurse Practitioners) who all work together to provide you with the care you need, when you need it. You will need a follow up appointment in 6 weeks.      Exercise Stress Test An exercise stress test is a test to check how your heart works during exercise. You will need to walk on a treadmill or ride an exercise bike for this test. An electrocardiogram (ECG) will record your heartbeat when you are at rest and when you are exercising. You may have an ultrasound or nuclear test after the exercise test. The test is done to check for coronary artery disease (CAD). It is also done to:  See how well you can exercise.  Watch for high blood pressure during exercise.  Test how well you can exercise after treatment.  Check the blood flow to your arms and legs. If your test result is not normal, more  testing may be needed.  What happens before the procedure?  Follow instructions from your doctor about what you cannot eat or drink. ? Do not have any drinks or foods that have caffeine in them for 24 hours before the test, or as told by your doctor. This includes coffee, tea (even decaf tea), sodas, chocolate, and cocoa.  Ask your doctor about changing or stopping your normal medicines. This is important if you: ? Take diabetes medicines. ? Take beta-blocker medicines. ? Wear a nitroglycerin patch.  If you use an inhaler, bring it with you to the test.  Do not put lotions, powders, creams, or oils on your chest before the test.  Wear comfortable shoes and clothing.  Do not use any products that have nicotine or tobacco in them, such as cigarettes and e-cigarettes. Stop using them at least 4 hours before the test. If you need help quitting, ask your doctor. What happens during the procedure?  Patches (electrodes) will be put on your chest.  Wires will be connected to the patches. The wires will send signals to a machine to record your heartbeat.  Your heart rate will be watched while you are resting and while you are exercising. Your blood pressure will also be watched during the test.  You will walk on a treadmill or use a stationary bike. If you cannot use these, you may be asked to turn a crank with your hands.  The activity will get harder and will raise your heart  rate.  You may be asked to breathe into a tube a few times during the test. This measures the gases that you breathe out.  You will be asked how you are feeling throughout the test.  You will exercise until your heart reaches a target heart rate. You will stop early if: ? You feel dizzy. ? You have chest pain. ? You are out of breath. ? Your blood pressure is too high or too low. ? You have an irregular heartbeat. ? You have pain or aching in your arms or legs. The procedure may vary among doctors and  hospitals. What happens after the procedure?  Your blood pressure, heart rate, breathing rate, and blood oxygen level will be watched after the test.  You may return to your normal diet and activities as told by your doctor.  It is up to you to get the results of your test. Ask your doctor, or the department that is doing the test, when your results will be ready. Summary  An exercise stress test is a test to check how your heart works during exercise.  This test is done to check for coronary artery disease.  Your heart rate will be watched while you are resting and while you are exercising.  Follow instructions from your doctor about what you cannot eat or drink before the test. This information is not intended to replace advice given to you by your health care provider. Make sure you discuss any questions you have with your health care provider. Document Released: 06/09/2007 Document Revised: 03/23/2016 Document Reviewed: 03/23/2016 Elsevier Interactive Patient Education  2019 [employer].

## 2018-03-18 DIAGNOSIS — N529 Male erectile dysfunction, unspecified: Secondary | ICD-10-CM | POA: Diagnosis not present

## 2018-03-18 DIAGNOSIS — Z1331 Encounter for screening for depression: Secondary | ICD-10-CM | POA: Diagnosis not present

## 2018-03-18 DIAGNOSIS — R531 Weakness: Secondary | ICD-10-CM | POA: Diagnosis not present

## 2018-03-18 DIAGNOSIS — E785 Hyperlipidemia, unspecified: Secondary | ICD-10-CM | POA: Diagnosis not present

## 2018-03-18 DIAGNOSIS — E291 Testicular hypofunction: Secondary | ICD-10-CM | POA: Diagnosis not present

## 2018-03-18 DIAGNOSIS — I420 Dilated cardiomyopathy: Secondary | ICD-10-CM | POA: Diagnosis not present

## 2018-03-30 ENCOUNTER — Telehealth: Payer: Self-pay | Admitting: *Deleted

## 2018-03-30 NOTE — Telephone Encounter (Signed)
Cancelled nuclear appt 

## 2018-04-18 ENCOUNTER — Telehealth: Payer: Self-pay | Admitting: Cardiology

## 2018-04-18 ENCOUNTER — Ambulatory Visit: Payer: BLUE CROSS/BLUE SHIELD | Admitting: Cardiology

## 2018-04-18 NOTE — Telephone Encounter (Signed)
I called the patient and offered him an appointment for stress myoview at [facility] location.  Patient is very concerned about being in the office during this time with Covid-19 pandemic.  Patient states that his symptoms are stable.  Patient prefers to wait on having stress myoview at a later time.  Patient advised that if symptoms worsen to contact our office and we will schedule test at [facility] office.  Patient agreed to plan and verbalized understanding.

## 2018-04-18 NOTE — Telephone Encounter (Signed)
I called patient to change appt to Televisit and patient declined due to his thought that it was a waste of his money.  He also has not had his stress test and he is concerned about that also.  Please can you speak with Dr. Bettina Gavia and make suggestions for patient.

## 2018-04-25 ENCOUNTER — Telehealth: Payer: BLUE CROSS/BLUE SHIELD | Admitting: Cardiology

## 2018-05-16 ENCOUNTER — Telehealth: Payer: Self-pay | Admitting: *Deleted

## 2018-05-16 NOTE — Telephone Encounter (Signed)
Patient given detailed instructions per Myocardial Perfusion Study Information Sheet for the test on  05/17/18. Patient notified that it is imperative to arrive on time for appointment to keep from having the test rescheduled.  If you need to cancel or reschedule your appointment, please call the office within 24 hours of your appointment.  Patient screened for Covid symptoms/exposure and denied the above. Patient informed that he would need to wear a mask upon entrance to the building, Patient verbalized understanding. Kirstie Peri

## 2018-05-17 ENCOUNTER — Other Ambulatory Visit: Payer: Self-pay

## 2018-05-17 ENCOUNTER — Ambulatory Visit (INDEPENDENT_AMBULATORY_CARE_PROVIDER_SITE_OTHER): Payer: BLUE CROSS/BLUE SHIELD

## 2018-05-17 DIAGNOSIS — R079 Chest pain, unspecified: Secondary | ICD-10-CM | POA: Diagnosis not present

## 2018-05-17 LAB — MYOCARDIAL PERFUSION IMAGING
LVDIAVOL: 119 mL (ref 62–150)
LVSYSVOL: 60 mL
Peak HR: 81 {beats}/min
Rest HR: 53 {beats}/min
SDS: 0
SRS: 7
SSS: 7
TID: 1.21

## 2018-05-17 MED ORDER — TECHNETIUM TC 99M TETROFOSMIN IV KIT
29.0000 | PACK | Freq: Once | INTRAVENOUS | Status: AC | PRN
  Administered 2018-05-17: 29 via INTRAVENOUS

## 2018-05-17 MED ORDER — TECHNETIUM TC 99M TETROFOSMIN IV KIT
7.7000 | PACK | Freq: Once | INTRAVENOUS | Status: AC | PRN
  Administered 2018-05-17: 7.7 via INTRAVENOUS

## 2018-05-17 MED ORDER — REGADENOSON 0.4 MG/5ML IV SOLN
0.4000 mg | Freq: Once | INTRAVENOUS | Status: AC
  Administered 2018-05-17: 0.4 mg via INTRAVENOUS

## 2018-05-19 ENCOUNTER — Telehealth: Payer: Self-pay | Admitting: Cardiology

## 2018-05-19 NOTE — Telephone Encounter (Signed)
Reviewed with Dr. Geraldo Pitter.   Please schedule Mr. Stankus for a visit with Dr. Bettina Gavia next week to evaluate this problem. Mr. Manzo cancelled his previous follow up appointment. Continue taking the Amlodipine 5mg  every evening. Ask him to please keep a log of BP and Dr. Bettina Gavia will follow up on it next week.   High blood pressure can cause headaches, but may not be the cause for him. Ask him to keep a log of when he has headaches (time, BP, severity on 0-10 scale).   Marland Kitchen

## 2018-05-19 NOTE — Telephone Encounter (Signed)
Patient reports that he was taken off of amlodipine at last office visit due to dizziness which was thought to be a side effect of the medication. His blood pressures had been controlled at that time.  Since stopping the medication, the dizziness has persisted. It occurs intermitently and he has also started having headaches. His BP has increased from previous levels of 120-130/70-80's range to 140-150/80-90 range. He notices the headaches when his BP is up.  He began taking the amlodipine again every evening 5 mg but it doesn't seem to be helping his BP's anymore.  His job has been very stressful and he takes xanax occasionally and notices the headaches and BP coming down then.  Pls advise, tx

## 2018-05-19 NOTE — Telephone Encounter (Signed)
Patient still has an elevated blood pressure. Please advise.

## 2018-05-22 ENCOUNTER — Telehealth: Payer: Self-pay | Admitting: Cardiology

## 2018-05-22 ENCOUNTER — Telehealth: Payer: Self-pay

## 2018-05-22 NOTE — Telephone Encounter (Signed)
Late entry for 05/22/18 at 0940:  Left voice message requesting callback to [facility] number regarding call and  follow-up with Dr. Bettina Gavia.

## 2018-05-22 NOTE — Telephone Encounter (Signed)
Virtual Visit Pre-Appointment Phone Call  "(Name), I am calling you today to discuss your upcoming appointment. We are currently trying to limit exposure to the virus that causes COVID-19 by seeing patients at home rather than in the office."  1. "What is the BEST phone number to call the day of the visit?" - include this in appointment notes  2. Do you have or have access to (through a family member/friend) a smartphone with video capability that we can use for your visit?" a. If yes - list this number in appt notes as cell (if different from BEST phone #) and list the appointment type as a VIDEO visit in appointment notes b. If no - list the appointment type as a PHONE visit in appointment notes  3. Confirm consent - "In the setting of the current Covid19 crisis, you are scheduled for a (phone or video) visit with your provider on (date) at (time).  Just as we do with many in-office visits, in order for you to participate in this visit, we must obtain consent.  If you'd like, I can send this to your mychart (if signed up) or email for you to review.  Otherwise, I can obtain your verbal consent now.  All virtual visits are billed to your insurance company just like a normal visit would be.  By agreeing to a virtual visit, we'd like you to understand that the technology does not allow for your provider to perform an examination, and thus may limit your provider's ability to fully assess your condition. If your provider identifies any concerns that need to be evaluated in person, we will make arrangements to do so.  Finally, though the technology is pretty good, we cannot assure that it will always work on either your or our end, and in the setting of a video visit, we may have to convert it to a phone-only visit.  In either situation, we cannot ensure that we have a secure connection.  Are you willing to proceed?" STAFF: Did the patient verbally acknowledge consent to telehealth visit? Document  YES/NO here: Yes  4. Advise patient to be prepared - "Two hours prior to your appointment, go ahead and check your blood pressure, pulse, oxygen saturation, and your weight (if you have the equipment to check those) and write them all down. When your visit starts, your provider will ask you for this information. If you have an Apple Watch or Kardia device, please plan to have heart rate information ready on the day of your appointment. Please have a pen and paper handy nearby the day of the visit as well."  5. Give patient instructions for MyChart download to smartphone OR [email or web address] as below if video visit (depending on what platform provider is using)  6. Inform patient they will receive a phone call 15 minutes prior to their appointment time (may be from unknown caller ID) so they should be prepared to answer    TELEPHONE CALL NOTE  Haidar Muse has been deemed a candidate for a follow-up tele-health visit to limit community exposure during the Covid-19 pandemic. I spoke with the patient via phone to ensure availability of phone/video source, confirm preferred email & phone number, and discuss instructions and expectations.  I reminded Jaron Czarnecki to be prepared with any vital sign and/or heart rhythm information that could potentially be obtained via home monitoring, at the time of his visit. I reminded Traveon Louro to expect a phone call prior to his visit.  Calla Kicks 05/22/2018 10:02 AM   INSTRUCTIONS FOR DOWNLOADING THE MYCHART APP TO SMARTPHONE  - The patient must first make sure to have activated MyChart and know their login information - If Apple, go to [employer] and type in MyChart in the search bar and download the app. If Android, ask patient to go to [employer] and type in [place] in the search bar and download the app. The app is free but as with any other app downloads, their phone may require them to verify saved payment information or Apple/Android password.  -  The patient will need to then log into the app with their MyChart username and password, and select [place] [place] as their healthcare provider to link the account. When it is time for your visit, go to the MyChart app, find appointments, and click Begin Video Visit. Be sure to Select Allow for your device to access the Microphone and Camera for your visit. You will then be connected, and your provider will be with you shortly.  **If they have any issues connecting, or need assistance please contact MyChart service desk [phone])**  **If using a computer, in order to ensure the best quality for their visit they will need to use either of the following Internet Browsers: [employer], or Google Chrome**  IF USING DOXIMITY or DOXY.ME - The patient will receive a link just prior to their visit by text.     FULL LENGTH CONSENT FOR TELE-HEALTH VISIT   I hereby voluntarily request, consent and authorize [facility] and its employed or contracted physicians, physician assistants, nurse practitioners or other licensed health care professionals (the Practitioner), to provide me with telemedicine health care services (the Services") as deemed necessary by the treating Practitioner. I acknowledge and consent to receive the Services by the Practitioner via telemedicine. I understand that the telemedicine visit will involve communicating with the Practitioner through live audiovisual communication technology and the disclosure of certain medical information by electronic transmission. I acknowledge that I have been given the opportunity to request an in-person assessment or other available alternative prior to the telemedicine visit and am voluntarily participating in the telemedicine visit.  I understand that I have the right to withhold or withdraw my consent to the use of telemedicine in the course of my care at any time, without affecting my right to future care or treatment, and that the  Practitioner or I may terminate the telemedicine visit at any time. I understand that I have the right to inspect all information obtained and/or recorded in the course of the telemedicine visit and may receive copies of available information for a reasonable fee.  I understand that some of the potential risks of receiving the Services via telemedicine include:   Delay or interruption in medical evaluation due to technological equipment failure or disruption;  Information transmitted may not be sufficient (e.g. poor resolution of images) to allow for appropriate medical decision making by the Practitioner; and/or   In rare instances, security protocols could fail, causing a breach of personal health information.  Furthermore, I acknowledge that it is my responsibility to provide information about my medical history, conditions and care that is complete and accurate to the best of my ability. I acknowledge that Practitioner's advice, recommendations, and/or decision may be based on factors not within their control, such as incomplete or inaccurate data provided by me or distortions of diagnostic images or specimens that may result from electronic transmissions. I understand that the  practice of medicine is not an Chief Strategy Officer and that Practitioner makes no warranties or guarantees regarding treatment outcomes. I acknowledge that I will receive a copy of this consent concurrently upon execution via email to the email address I last provided but may also request a printed copy by calling the office of [facility].    I understand that my insurance will be billed for this visit.   I have read or had this consent read to me.  I understand the contents of this consent, which adequately explains the benefits and risks of the Services being provided via telemedicine.   I have been provided ample opportunity to ask questions regarding this consent and the Services and have had my questions answered to my  satisfaction.  I give my informed consent for the services to be provided through the use of telemedicine in my medical care  By participating in this telemedicine visit I agree to the above.

## 2018-05-24 ENCOUNTER — Telehealth: Payer: Self-pay

## 2018-05-24 NOTE — Telephone Encounter (Signed)
-----   Message from Richardo Priest, MD sent at 05/18/2018  7:49 AM EDT ----- Regarding: FW: Good result no changes in treatment  EF remains near normal ----- Message ----- From: Jenean Lindau, MD Sent: 05/17/2018  12:44 PM EDT To: Richardo Priest, MD

## 2018-05-24 NOTE — Telephone Encounter (Signed)
Information relayed to patient, no further questions.

## 2018-05-25 NOTE — Progress Notes (Signed)
Virtual Visit via Telephone Note   This visit type was conducted due to national recommendations for restrictions regarding the COVID-19 Pandemic (e.g. social distancing) in an effort to limit this patient's exposure and mitigate transmission in our community.  Due to his co-morbid illnesses, this patient is at least at moderate risk for complications without adequate follow up.  This format is felt to be most appropriate for this patient at this time.  The patient did not have access to video technology/had technical difficulties with video requiring transitioning to audio format only (telephone).  All issues noted in this document were discussed and addressed.  No physical exam could be performed with this format.  Please refer to the patient's chart for his  consent to telehealth for [facility].   Date:  05/25/2018   ID:  Bobby Ellis, DOB September 06, 1955, MRN [id number]  Patient Location: Home Provider Location: Office  PCP:  Nicoletta Dress, MD  Cardiologist:  Shirlee More, MD  Electrophysiologist:  None   Evaluation Performed:  Follow-Up Visit  Chief Complaint:  63 yo male presents for follow up of dizziness and to discuss results of [place].   History of Present Illness:    Bobby Ellis is a 63 y.o. male with a hx of dilated cardiomyopathy with heart failure, hypertension, PVC's and mild mitral regurgitation previously cared for by Dr Jimmie Molly at [facility]  last seen there 09/30/16. His last documented EF 55% previously described as 45%. He was   last seen by me 03/07/18 with symptomatic orthostatic hypotension.  Myocardial perfusion study (Lexiscan) 05/17/2018 showed EF 49%, no ST segment deviaion and concluded as low risk study. Rest HR 53 and peak HR 81.   He records home Bps and reports that they "fluctuate". SBP typically 135-145. DBP typically 80s to low 90s. Associates blood pressure with headaches. Headaches occur practically everyday. Reports history of headaches with  high blood pressure and sinus infections, but does not feel he has a sinus infection at this time. Patient has been seen by his PCP for headaches and was told to follow up with cardiology.   He reports dizziness every few days. Previously stopped amlopidine for dizziness which did not improve dizziness. Will take a Xanax for this and sometimes feel better 30 minutes to an hour later. Reports dizziness is worse when standing, does not seem to have a temporal factor nor aggravating factor. He denies syncope.   Reports intermittent SOB with activity.   Because of symptomatic orthostatic hypotension we have stopped his calcium channel blockers subsequently he had systolics greater than 1 2202 headache was restarted now he feels better and his blood pressure is running on average in the range of 1 40/70.  At this time I would not intensify his antihypertensive agents I asked him to continue to follow as an outpatient and contact me if having systolics greater than 542 and I would place him on a diuretic either chlorthalidone or spironolactone and continue his beta-blocker and calcium channel blocker along with ARB.  We reviewed her recent echocardiogram showed an EF of 49% near normal stable cardiomyopathy  The patient does not have symptoms concerning for COVID-19 infection (fever, chills, cough, or new shortness of breath).    Past Medical History:  Diagnosis Date  . Anxiety 03/11/2017  . Benign hypertension 03/11/2017  . Cardiomyopathy ([place]) 09/28/2016  . Dilated cardiomyopathy ([place]) 09/28/2016  . Erectile dysfunction 03/11/2017  . External hemorrhoid, thrombosed 03/11/2017  . Heart murmur 03/11/2017  . Hyperlipemia   .  Hyperlipidemia 09/30/2016  . Hypogonadism in male 03/11/2017  . Ingrowing nail 12/31/2015  . Mild mitral regurgitation 03/11/2017  . Nail dystrophy 12/31/2015  . Palpitations 03/11/2017  . Panic disorder 03/11/2017  . PVC (premature ventricular contraction) 03/11/2017  . RLS (restless legs  syndrome) 03/11/2017   Past Surgical History:  Procedure Laterality Date  . HERNIA REPAIR       No outpatient medications have been marked as taking for the 05/26/18 encounter (Appointment) with Richardo Priest, MD.     Allergies:   Shellfish allergy   Social History   Tobacco Use  . Smoking status: Never Smoker  . Smokeless tobacco: Never Used  Substance Use Topics  . Alcohol use: Yes    Comment: socially  . Drug use: No     Family Hx: The patient's family history includes Colon polyps in his brother and father; Pancreatic cancer in his mother.  ROS:   Please see the history of present illness.     All other systems reviewed and are negative.   Prior CV studies:   The following studies were reviewed today:  Echo 01/24/18:   Study Conclusions - Left ventricle: The cavity size was normal. Wall thickness was normal. Systolic function was normal. The estimated ejection fraction was in the range of 55% to 60%. Wall motion was normal; there were no regional wall motion abnormalities. Left ventricular diastolic function parameters were normal. - Aortic valve: There was trivial regurgitation. No MR - Ascending aorta: The ascending aorta was mildly dilated.  Gated SPECT lexiscan 05/17/18:    Study Highlights   Nuclear stress EF: 49%.  The left ventricular ejection fraction is caculated at 49%. Visually appears lower limit of normal.  There was no ST segment deviation noted during stress.  The study is normal.  This is a low risk study.    Labs/Other Tests and Data Reviewed:    EKG:  No ECG reviewed.  Recent Labs: No results found for requested labs within last 8760 hours.   Recent Lipid Panel No results found for: CHOL, TRIG, HDL, CHOLHDL, LDLCALC, LDLDIRECT  Wt Readings from Last 3 Encounters:  05/17/18 178 lb (80.7 kg)  03/07/18 178 lb (80.7 kg)  12/07/17 177 lb 3.2 oz (80.4 kg)     Objective:    Vital Signs:  There were no vitals taken for  this visit.   VITAL SIGNS:  reviewed NEURO:  alert and oriented x 3, no obvious focal deficit PSYCH:  normal affect  ASSESSMENT & PLAN:    1. Hypertensive heart disease with heart failure -  2. Headache -  3. Dizziness - Previously trial off of Amlodipine without improvement. Dizziness every couple of days while standing without syncope.  4. Lipitor - Managed by PCP. Continue statin. Encourage low fat, heart healthy diet.   COVID-19 Education: The signs and symptoms of COVID-19 were discussed with the patient and how to seek care for testing (follow up with PCP or arrange E-visit).  The importance of social distancing was discussed today.  Time:   Today, I have spent 20 minutes with the patient with telehealth technology discussing the above problems.     Medication Adjustments/Labs and Tests Ordered: Current medicines are reviewed at length with the patient today.  Concerns regarding medicines are outlined above.   Tests Ordered: No orders of the defined types were placed in this encounter.   Medication Changes: No orders of the defined types were placed in this encounter.   Disposition:  Follow up in 3 month(s)  Signed, Shirlee More, MD  05/25/2018 3:17 PM    [place]

## 2018-05-26 ENCOUNTER — Encounter: Payer: Self-pay | Admitting: Cardiology

## 2018-05-26 ENCOUNTER — Telehealth (INDEPENDENT_AMBULATORY_CARE_PROVIDER_SITE_OTHER): Payer: BLUE CROSS/BLUE SHIELD | Admitting: Cardiology

## 2018-05-26 ENCOUNTER — Other Ambulatory Visit: Payer: Self-pay

## 2018-05-26 VITALS — BP 142/87 | HR 64 | Ht 72.0 in | Wt 171.0 lb

## 2018-05-26 DIAGNOSIS — E785 Hyperlipidemia, unspecified: Secondary | ICD-10-CM

## 2018-05-26 DIAGNOSIS — I110 Hypertensive heart disease with heart failure: Secondary | ICD-10-CM

## 2018-05-26 DIAGNOSIS — R42 Dizziness and giddiness: Secondary | ICD-10-CM | POA: Insufficient documentation

## 2018-05-26 DIAGNOSIS — R519 Headache, unspecified: Secondary | ICD-10-CM | POA: Insufficient documentation

## 2018-05-26 NOTE — Patient Instructions (Addendum)
Medication Instructions:  Your physician recommends that you continue on your current medications as directed. Please refer to the Current Medication list given to you today.  If you need a refill on your cardiac medications before your next appointment, please call your pharmacy.   Lab work: None  If you have labs (blood work) drawn today and your tests are completely normal, you will receive your results only by: Marland Kitchen MyChart Message (if you have MyChart) OR . A paper copy in the mail If you have any lab test that is abnormal or we need to change your treatment, we will call you to review the results.  Testing/Procedures: None  Follow-Up: At [facility], you and your health needs are our priority.  As part of our continuing mission to provide you with exceptional heart care, we have created designated Provider Care Teams.  These Care Teams include your primary Cardiologist (physician) and Advanced Practice Providers (APPs -  Physician Assistants and Nurse Practitioners) who all work together to provide you with the care you need, when you need it. You will need a follow up appointment in 3 months: Friday, 09/01/2018, at 1:20 pm in the [place] office.

## 2018-06-24 DIAGNOSIS — E291 Testicular hypofunction: Secondary | ICD-10-CM | POA: Diagnosis not present

## 2018-06-24 DIAGNOSIS — E785 Hyperlipidemia, unspecified: Secondary | ICD-10-CM | POA: Diagnosis not present

## 2018-06-24 DIAGNOSIS — I420 Dilated cardiomyopathy: Secondary | ICD-10-CM | POA: Diagnosis not present

## 2018-06-24 DIAGNOSIS — N529 Male erectile dysfunction, unspecified: Secondary | ICD-10-CM | POA: Diagnosis not present

## 2018-09-01 ENCOUNTER — Ambulatory Visit: Payer: BLUE CROSS/BLUE SHIELD | Admitting: Cardiology

## 2018-09-30 DIAGNOSIS — I420 Dilated cardiomyopathy: Secondary | ICD-10-CM | POA: Diagnosis not present

## 2018-09-30 DIAGNOSIS — Z23 Encounter for immunization: Secondary | ICD-10-CM | POA: Diagnosis not present

## 2018-09-30 DIAGNOSIS — N529 Male erectile dysfunction, unspecified: Secondary | ICD-10-CM | POA: Diagnosis not present

## 2018-09-30 DIAGNOSIS — Z0000 Encounter for general adult medical examination without abnormal findings: Secondary | ICD-10-CM | POA: Diagnosis not present

## 2018-09-30 DIAGNOSIS — E785 Hyperlipidemia, unspecified: Secondary | ICD-10-CM | POA: Diagnosis not present

## 2018-09-30 DIAGNOSIS — E291 Testicular hypofunction: Secondary | ICD-10-CM | POA: Diagnosis not present

## 2018-11-09 ENCOUNTER — Ambulatory Visit: Payer: BC Managed Care – PPO | Admitting: Cardiology

## 2018-11-21 ENCOUNTER — Encounter: Payer: Self-pay | Admitting: Cardiology

## 2018-11-21 ENCOUNTER — Other Ambulatory Visit: Payer: Self-pay

## 2018-11-21 ENCOUNTER — Ambulatory Visit (INDEPENDENT_AMBULATORY_CARE_PROVIDER_SITE_OTHER): Payer: BC Managed Care – PPO | Admitting: Cardiology

## 2018-11-21 VITALS — BP 120/80 | HR 72 | Ht 72.0 in | Wt 174.2 lb

## 2018-11-21 DIAGNOSIS — I5042 Chronic combined systolic (congestive) and diastolic (congestive) heart failure: Secondary | ICD-10-CM | POA: Diagnosis not present

## 2018-11-21 DIAGNOSIS — I420 Dilated cardiomyopathy: Secondary | ICD-10-CM | POA: Diagnosis not present

## 2018-11-21 DIAGNOSIS — I340 Nonrheumatic mitral (valve) insufficiency: Secondary | ICD-10-CM

## 2018-11-21 DIAGNOSIS — I110 Hypertensive heart disease with heart failure: Secondary | ICD-10-CM

## 2018-11-21 DIAGNOSIS — I493 Ventricular premature depolarization: Secondary | ICD-10-CM

## 2018-11-21 DIAGNOSIS — E785 Hyperlipidemia, unspecified: Secondary | ICD-10-CM

## 2018-11-21 NOTE — Progress Notes (Signed)
Cardiology Office Note:    Date:  11/21/2018   ID:  Bobby Ellis, DOB [birth year], MRN [id number]  PCP:  Nicoletta Dress, MD  Cardiologist:  Shirlee More, MD    Referring MD: Nicoletta Dress, MD    ASSESSMENT:    1. Chronic combined systolic and diastolic heart failure ([place])   2. Dilated cardiomyopathy ([place])   3. Hypertensive heart disease with heart failure ([place])   4. Mild mitral regurgitation   5. PVC (premature ventricular contraction)   6. Hyperlipidemia, unspecified hyperlipidemia type    PLAN:    In order of problems listed above:  1. Stable does not require loop diuretic no signs or symptoms of heart failure New York Heart Association class I continueCurrent treatment including beta-blocker ARB. 2. Improved EF low normal to mildly reduced as detailed below continue guideline directed therapy 3. BP at target continue current treatment no further episodes of orthostatic hypotension 4. Stable recent echocardiogram 5. Asymptomatic 6. Stable lipids at target continue statin  Next appointment: 6 months   Medication Adjustments/Labs and Tests Ordered: Current medicines are reviewed at length with the patient today.  Concerns regarding medicines are outlined above.  No orders of the defined types were placed in this encounter.  No orders of the defined types were placed in this encounter.   Chief Complaint  Patient presents with  . Follow-up  . Congestive Heart Failure  . Cardiomyopathy    History of Present Illness:    Bobby Ellis is a 63 y.o. male with a hx of dilated cardiomyopathy with heart failure, hypertension, PVC's and mild mitral regurgitation and symptomatic hypotension previously cared for by Dr Bobby Ellis at [facility]  last seen there 09/30/16. His last documented EF 55% previously described as 45%. Myocardial perfusion study (Lexiscan) 05/17/2018 showed EF 49%, no ST segment deviaion and concluded as low risk study. Rest HR 53 and peak HR 81.     He last seen 05/26/2018 . Compliance with diet, lifestyle and medications: Yes  Is a great deal of stress related to work is made decision to retire in general is doing better he has had no orthostatic lightheadedness chest pain edema shortness of breath or palpitation.  Tolerates his cardiac medications for cardiomyopathy recent labs shows lipids to be ideal cholesterol 109 LDL 73 HDL 36 normal liver function. Past Medical History:  Diagnosis Date  . Anxiety 03/11/2017  . Benign hypertension 03/11/2017  . Cardiomyopathy ([place]) 09/28/2016  . Dilated cardiomyopathy ([place]) 09/28/2016  . Erectile dysfunction 03/11/2017  . External hemorrhoid, thrombosed 03/11/2017  . Heart murmur 03/11/2017  . Hyperlipemia   . Hyperlipidemia 09/30/2016  . Hypogonadism in male 03/11/2017  . Ingrowing nail 12/31/2015  . Mild mitral regurgitation 03/11/2017  . Nail dystrophy 12/31/2015  . Palpitations 03/11/2017  . Panic disorder 03/11/2017  . PVC (premature ventricular contraction) 03/11/2017  . RLS (restless legs syndrome) 03/11/2017    Past Surgical History:  Procedure Laterality Date  . HERNIA REPAIR      Current Medications: Current Meds  Medication Sig  . ALPRAZolam (XANAX) 0.25 MG tablet Take 0.25 mg by mouth 2 (two) times daily as needed for anxiety.  Marland Kitchen amLODipine (NORVASC) 5 MG tablet Take 5 mg by mouth daily.  . candesartan (ATACAND) 32 MG tablet TAKE 1 TABLET DAILY  . carvedilol (COREG) 12.5 MG tablet TAKE 1 TABLET TWICE A DAY  . simvastatin (ZOCOR) 20 MG tablet Take 20 mg by mouth every evening.  . testosterone (ANDROGEL) 50 MG/5GM GEL  Place 5 g onto the skin daily.     Allergies:   Shellfish allergy   Social History   Socioeconomic History  . Marital status: Single    Spouse name: Not on file  . Number of children: Not on file  . Years of education: Not on file  . Highest education level: Not on file  Occupational History  . Not on file  Social Needs  . Financial resource strain: Not on file  .  Food insecurity    Worry: Not on file    Inability: Not on file  . Transportation needs    Medical: Not on file    Non-medical: Not on file  Tobacco Use  . Smoking status: Never Smoker  . Smokeless tobacco: Never Used  Substance and Sexual Activity  . Alcohol use: Yes    Comment: socially  . Drug use: No  . Sexual activity: Not on file  Lifestyle  . Physical activity    Days per week: Not on file    Minutes per session: Not on file  . Stress: Not on file  Relationships  . Social Herbalist on phone: Not on file    Gets together: Not on file    Attends religious service: Not on file    Active member of club or organization: Not on file    Attends meetings of clubs or organizations: Not on file    Relationship status: Not on file  Other Topics Concern  . Not on file  Social History Narrative  . Not on file     Family History: The patient's family history includes Colon polyps in his brother and father; Pancreatic cancer in his mother. ROS:   Please see the history of present illness.    All other systems reviewed and are negative.  EKGs/Labs/Other Studies Reviewed:    The following studies were reviewed today:  EKG:  EKGpersonally reviewed demonstrates sinus rhythm nonspecific ST change performed last visit  Recent Labs: No results found for requested labs within last 8760 hours.  Recent Lipid Panel No results found for: CHOL, TRIG, HDL, CHOLHDL, VLDL, LDLCALC, LDLDIRECT  Physical Exam:    VS:  BP 120/80 (BP Location: Right Arm, Patient Position: Sitting, Cuff Size: Normal)   Pulse 72   Ht 6' (1.829 m)   Wt 174 lb 3.2 oz (79 kg)   SpO2 98%   BMI 23.63 kg/m     Wt Readings from Last 3 Encounters:  11/21/18 174 lb 3.2 oz (79 kg)  05/26/18 171 lb (77.6 kg)  05/17/18 178 lb (80.7 kg)     GEN:  Well nourished, well developed in no acute distress HEENT: Normal NECK: No JVD; No carotid bruits LYMPHATICS: No lymphadenopathy CARDIAC: RRR, no  murmurs, rubs, gallops RESPIRATORY:  Clear to auscultation without rales, wheezing or rhonchi  ABDOMEN: Soft, non-tender, non-distended MUSCULOSKELETAL:  No edema; No deformity  SKIN: Warm and dry NEUROLOGIC:  Alert and oriented x 3 PSYCHIATRIC:  Normal affect    Signed, Shirlee More, MD  11/21/2018 3:08 PM    [facility]

## 2018-11-21 NOTE — Patient Instructions (Signed)
Medication Instructions:  Your physician recommends that you continue on your current medications as directed. Please refer to the Current Medication list given to you today.  *If you need a refill on your cardiac medications before your next appointment, please call your pharmacy*  Lab Work: None  If you have labs (blood work) drawn today and your tests are completely normal, you will receive your results only by: Marland Kitchen MyChart Message (if you have MyChart) OR . A paper copy in the mail If you have any lab test that is abnormal or we need to change your treatment, we will call you to review the results.  Testing/Procedures: None  Follow-Up: At [facility], you and your health needs are our priority.  As part of our continuing mission to provide you with exceptional heart care, we have created designated Provider Care Teams.  These Care Teams include your primary Cardiologist (physician) and Advanced Practice Providers (APPs -  Physician Assistants and Nurse Practitioners) who all work together to provide you with the care you need, when you need it.  Your next appointment:   6 month(s)  The format for your next appointment:   In Person  Provider:   Shirlee More, MD

## 2018-12-09 DIAGNOSIS — I420 Dilated cardiomyopathy: Secondary | ICD-10-CM | POA: Diagnosis not present

## 2018-12-09 DIAGNOSIS — Z0000 Encounter for general adult medical examination without abnormal findings: Secondary | ICD-10-CM | POA: Diagnosis not present

## 2018-12-09 DIAGNOSIS — E785 Hyperlipidemia, unspecified: Secondary | ICD-10-CM | POA: Diagnosis not present

## 2018-12-09 DIAGNOSIS — E291 Testicular hypofunction: Secondary | ICD-10-CM | POA: Diagnosis not present

## 2019-01-23 ENCOUNTER — Telehealth: Payer: Self-pay | Admitting: Cardiology

## 2019-01-23 ENCOUNTER — Other Ambulatory Visit: Payer: Self-pay | Admitting: *Deleted

## 2019-01-23 MED ORDER — CARVEDILOL 12.5 MG PO TABS: 12.5000 mg | ORAL_TABLET | Freq: Two times a day (BID) | ORAL | 1 refills | Status: AC

## 2019-01-23 NOTE — Telephone Encounter (Signed)
*  STAT* If patient is at the pharmacy, call can be transferred to refill team.   1. Which medications need to be refilled? (please list name of each medication and dose if known) carvedilol (COREG) 12.5 MG tablet  2. Which pharmacy/location (including street and city if local pharmacy) is medication to be sent to?[facility] [place], [place] AT Portal to Registered [facility] Sites  3. Do they need a 30 day or 90 day supply? 90 day

## 2019-01-23 NOTE — Telephone Encounter (Signed)
Refill sent to [facility]

## 2019-02-13 ENCOUNTER — Other Ambulatory Visit: Payer: Self-pay | Admitting: Cardiology

## 2019-04-25 ENCOUNTER — Other Ambulatory Visit: Payer: Self-pay | Admitting: Cardiology

## 2019-04-25 MED ORDER — CANDESARTAN CILEXETIL 32 MG PO TABS: 32.0000 mg | ORAL_TABLET | Freq: Every day | ORAL | 3 refills | Status: DC

## 2019-04-25 NOTE — Telephone Encounter (Signed)
New Message   *STAT* If patient is at the pharmacy, call can be transferred to refill team.   1. Which medications need to be refilled? (please list name of each medication and dose if known)  candesartan (ATACAND) 32 MG tablet  2. Which pharmacy/location (including street and city if local pharmacy) is medication to be sent to? [facility] [place], [place] AT Portal to Registered [facility] Sites  3. Do they need a 30 day or 90 day supply? 90 day supply

## 2019-04-25 NOTE — Telephone Encounter (Signed)
Spoke with patient and let him know that I just sent in his refill.

## 2019-05-30 ENCOUNTER — Other Ambulatory Visit: Payer: Self-pay

## 2019-06-01 ENCOUNTER — Encounter: Payer: Self-pay | Admitting: Cardiology

## 2019-06-01 ENCOUNTER — Other Ambulatory Visit: Payer: Self-pay

## 2019-06-01 ENCOUNTER — Ambulatory Visit: Payer: BC Managed Care – PPO | Admitting: Cardiology

## 2019-06-01 VITALS — BP 136/80 | HR 56 | Ht 72.0 in | Wt 166.8 lb

## 2019-06-01 DIAGNOSIS — I493 Ventricular premature depolarization: Secondary | ICD-10-CM | POA: Diagnosis not present

## 2019-06-01 DIAGNOSIS — I110 Hypertensive heart disease with heart failure: Secondary | ICD-10-CM | POA: Diagnosis not present

## 2019-06-01 DIAGNOSIS — I420 Dilated cardiomyopathy: Secondary | ICD-10-CM

## 2019-06-01 DIAGNOSIS — E785 Hyperlipidemia, unspecified: Secondary | ICD-10-CM

## 2019-06-01 NOTE — Patient Instructions (Signed)

## 2019-06-01 NOTE — Progress Notes (Signed)
Cardiology Office Note:    Date:  06/01/2019   ID:  Bobby Ellis, DOB 03-04-1955, MRN [id number]  PCP:  Nicoletta Dress, MD  Cardiologist:  Shirlee More, MD    Referring MD: Nicoletta Dress, MD    ASSESSMENT:    1. Hypertensive heart disease with heart failure ([place])   2. Dilated cardiomyopathy ([place])   3. PVC (premature ventricular contraction)   4. Hyperlipidemia, unspecified hyperlipidemia type    PLAN:    In order of problems listed above:  1. Stable BP is at target he will continue his current guideline directed therapy including beta-blocker ARB calcium channel blocker.  He does not require diuretic as no fluid overload New York Heart Association class I note his ejection fraction has normalized. 2. Improved asymptomatic and present on EKG 3. Lipids at target continue his current lipid-lowering treatment with an intermediate intensity statin   Next appointment: 1 year   Medication Adjustments/Labs and Tests Ordered: Current medicines are reviewed at length with the patient today.  Concerns regarding medicines are outlined above.  No orders of the defined types were placed in this encounter.  No orders of the defined types were placed in this encounter.   Chief Complaint  Patient presents with  . Follow-up  . Congestive Heart Failure  . Hypertension  . Cardiomyopathy    History of Present Illness:    Bobby Ellis is a 64 y.o. male with a hx of dilated cardiomyopathy with heart failure, hypertension, PVC's and mild mitral regurgitation and symptomatic hypotension previously cared for by Dr Jimmie Molly at [facility]. His last documented EF 55% previously described as 45%. Myocardial perfusion study (Lexiscan) 05/17/2018 showed EF 49%, no ST segment deviaion and concluded as low risk study.  Echocardiogram 01/24/2018 showed the left ventricle to be normal dimension no hypertrophy ejection fraction 55 to 60% without segmental LV dysfunction. He was last seen  11/21/2018. Compliance with diet, lifestyle and medications: Yes  He has retired and this markedly improved the quality of his life he feels healthier he has had no shortness of breath edema blood pressure is at target and compliant with medications. Past Medical History:  Diagnosis Date  . Anxiety 03/11/2017  . Benign hypertension 03/11/2017  . Cardiomyopathy ([place]) 09/28/2016  . Dilated cardiomyopathy ([place]) 09/28/2016  . Erectile dysfunction 03/11/2017  . External hemorrhoid, thrombosed 03/11/2017  . Heart murmur 03/11/2017  . Hyperlipemia   . Hyperlipidemia 09/30/2016  . Hypogonadism in male 03/11/2017  . Ingrowing nail 12/31/2015  . Mild mitral regurgitation 03/11/2017  . Nail dystrophy 12/31/2015  . Palpitations 03/11/2017  . Panic disorder 03/11/2017  . PVC (premature ventricular contraction) 03/11/2017  . RLS (restless legs syndrome) 03/11/2017    Past Surgical History:  Procedure Laterality Date  . HERNIA REPAIR      Current Medications: Current Meds  Medication Sig  . ALPRAZolam (XANAX) 0.25 MG tablet Take 0.25 mg by mouth 2 (two) times daily as needed for anxiety.  Marland Kitchen amLODipine (NORVASC) 5 MG tablet Take 5 mg by mouth daily.  . candesartan (ATACAND) 32 MG tablet Take 1 tablet (32 mg total) by mouth daily.  . carvedilol (COREG) 12.5 MG tablet Take 1 tablet (12.5 mg total) by mouth 2 (two) times daily.  . simvastatin (ZOCOR) 20 MG tablet Take 20 mg by mouth every evening.  . testosterone (ANDROGEL) 50 MG/5GM GEL Place 5 g onto the skin daily.  . Testosterone 20.25 MG/ACT (1.62%) GEL Place 4 Pump onto the skin daily.  Marland Kitchen  traMADol (ULTRAM) 50 MG tablet Take 50 mg by mouth 4 (four) times daily as needed.     Allergies:   Shellfish allergy   Social History   Socioeconomic History  . Marital status: Single    Spouse name: Not on file  . Number of children: Not on file  . Years of education: Not on file  . Highest education level: Not on file  Occupational History  . Not on file    Tobacco Use  . Smoking status: Never Smoker  . Smokeless tobacco: Never Used  Substance and Sexual Activity  . Alcohol use: Yes    Comment: socially  . Drug use: No  . Sexual activity: Not on file  Other Topics Concern  . Not on file  Social History Narrative  . Not on file   Social Determinants of Health   Financial Resource Strain:   . Difficulty of Paying Living Expenses:   Food Insecurity:   . Worried About Charity fundraiser in the Last Year:   . Arboriculturist in the Last Year:   Transportation Needs:   . Film/video editor (Medical):   Marland Kitchen Lack of Transportation (Non-Medical):   Physical Activity:   . Days of Exercise per Week:   . Minutes of Exercise per Session:   Stress:   . Feeling of Stress :   Social Connections:   . Frequency of Communication with Friends and Family:   . Frequency of Social Gatherings with Friends and Family:   . Attends Religious Services:   . Active Member of Clubs or Organizations:   . Attends Archivist Meetings:   Marland Kitchen Marital Status:      Family History: The patient's family history includes Colon polyps in his brother and father; Pancreatic cancer in his mother. ROS:   Please see the history of present illness.    All other systems reviewed and are negative.  EKGs/Labs/Other Studies Reviewed:    The following studies were reviewed today:  EKG:  EKG ordered today and personally reviewed.  The ekg ordered today demonstrates sinus rhythm QS in V2 lead placement or old anteroseptal MI otherwise normal  Recent Labs: 03/17/2019: Lipids at target cholesterol 121, triglycerides 71, HDL 38 LDL 66. Normal creatinine 1.0  Physical Exam:    VS:  BP 136/80 (BP Location: Right Arm, Patient Position: Sitting, Cuff Size: Normal)   Pulse (!) 56   Ht 6' (1.829 m)   Wt 166 lb 12.8 oz (75.7 kg)   SpO2 98%   BMI 22.62 kg/m     Wt Readings from Last 3 Encounters:  06/01/19 166 lb 12.8 oz (75.7 kg)  11/21/18 174 lb 3.2 oz  (79 kg)  05/26/18 171 lb (77.6 kg)     GEN:  Well nourished, well developed in no acute distress HEENT: Normal NECK: No JVD; No carotid bruits LYMPHATICS: No lymphadenopathy CARDIAC: RRR, no murmurs, rubs, gallops RESPIRATORY:  Clear to auscultation without rales, wheezing or rhonchi  ABDOMEN: Soft, non-tender, non-distended MUSCULOSKELETAL:  No edema; No deformity  SKIN: Warm and dry NEUROLOGIC:  Alert and oriented x 3 PSYCHIATRIC:  Normal affect    Signed, Shirlee More, MD  06/01/2019 4:13 PM    [facility]

## 2019-06-01 NOTE — Addendum Note (Signed)
Addended by: Sayuri Rhames, Jonelle Sidle L on: 06/01/2019 04:25 PM   Modules accepted: Orders

## 2019-08-06 ENCOUNTER — Encounter: Payer: Self-pay | Admitting: Gastroenterology

## 2019-09-26 ENCOUNTER — Other Ambulatory Visit: Payer: Self-pay

## 2019-09-26 ENCOUNTER — Ambulatory Visit (AMBULATORY_SURGERY_CENTER): Payer: Self-pay | Admitting: *Deleted

## 2019-09-26 VITALS — Ht 72.0 in | Wt 164.0 lb

## 2019-09-26 DIAGNOSIS — Z86010 Personal history of colonic polyps: Secondary | ICD-10-CM

## 2019-09-26 MED ORDER — NA SULFATE-K SULFATE-MG SULF 17.5-3.13-1.6 GM/177ML PO SOLN: 1.0000 | Freq: Once | ORAL | 0 refills | Status: AC

## 2019-09-26 NOTE — Progress Notes (Signed)

## 2019-10-01 ENCOUNTER — Telehealth: Payer: Self-pay | Admitting: Gastroenterology

## 2019-10-01 NOTE — Telephone Encounter (Signed)
Pt is requesting a call back from a nurse to discuss the prep regarding his colonoscopy.

## 2019-10-01 NOTE — Telephone Encounter (Signed)
Pt does not need a call back, pt has resolved this issue with his insurance

## 2019-10-10 ENCOUNTER — Encounter: Payer: Self-pay | Admitting: Gastroenterology

## 2019-10-10 ENCOUNTER — Other Ambulatory Visit: Payer: Self-pay

## 2019-10-10 ENCOUNTER — Ambulatory Visit (AMBULATORY_SURGERY_CENTER): Payer: 59 | Admitting: Gastroenterology

## 2019-10-10 VITALS — BP 136/74 | HR 60 | Temp 96.9°F | Resp 14 | Ht 72.0 in | Wt 164.0 lb

## 2019-10-10 DIAGNOSIS — Z86010 Personal history of colonic polyps: Secondary | ICD-10-CM

## 2019-10-10 DIAGNOSIS — D122 Benign neoplasm of ascending colon: Secondary | ICD-10-CM | POA: Diagnosis not present

## 2019-10-10 DIAGNOSIS — D128 Benign neoplasm of rectum: Secondary | ICD-10-CM | POA: Diagnosis not present

## 2019-10-10 DIAGNOSIS — K621 Rectal polyp: Secondary | ICD-10-CM | POA: Diagnosis not present

## 2019-10-10 MED ORDER — SODIUM CHLORIDE 0.9 % IV SOLN: 500.0000 mL | Freq: Once | INTRAVENOUS | Status: AC

## 2019-10-10 NOTE — Progress Notes (Signed)
Called to room to assist during endoscopic procedure.  Patient ID and intended procedure confirmed with present staff. Received instructions for my participation in the procedure from the performing physician.  

## 2019-10-10 NOTE — Progress Notes (Signed)
To PACU, VSS. Report to [email or web address] 

## 2019-10-10 NOTE — Op Note (Signed)
[facility] Patient Name: Bobby Ellis Procedure Date: 10/10/2019 9:11 AM MRN: [id number] Endoscopist: Jackquline Denmark , MD Age: 64 Referring MD:  Date of Birth: [birth year] Gender: Male Account #: [id number] Procedure:                Colonoscopy Indications:              High risk colon cancer surveillance: Personal                            history of colonic polyps Medicines:                Monitored Anesthesia Care Procedure:                Pre-Anesthesia Assessment:                           - Prior to the procedure, a History and Physical                            was performed, and patient medications and                            allergies were reviewed. The patient's tolerance of                            previous anesthesia was also reviewed. The risks                            and benefits of the procedure and the sedation                            options and risks were discussed with the patient.                            All questions were answered, and informed consent                            was obtained. Prior Anticoagulants: The patient has                            taken no previous anticoagulant or antiplatelet                            agents. ASA Grade Assessment: II - A patient with                            mild systemic disease. After reviewing the risks                            and benefits, the patient was deemed in                            satisfactory condition to undergo the procedure.  After obtaining informed consent, the colonoscope                            was passed under direct vision. Throughout the                            procedure, the patient's blood pressure, pulse, and                            oxygen saturations were monitored continuously. The                            Colonoscope was introduced through the anus and                            advanced to the 2 cm into the ileum. The                             colonoscopy was performed without difficulty. The                            patient tolerated the procedure well. The quality                            of the bowel preparation was good. The terminal                            ileum, ileocecal valve, appendiceal orifice, and                            rectum were photographed. Scope In: 9:25:22 AM Scope Out: 9:35:20 AM Scope Withdrawal Time: 0 hours 7 minutes 22 seconds  Total Procedure Duration: 0 hours 9 minutes 58 seconds  Findings:                 Two sessile polyps were found in the rectum and                            proximal ascending colon. The polyps were 4 to 6 mm                            in size. These polyps were removed with a cold                            snare. Resection and retrieval were complete.                           A few small-mouthed diverticula were found in the                            sigmoid colon.                           Non-bleeding external and internal hemorrhoids were  found during retroflexion and during perianal exam.                            The hemorrhoids were small.                           The terminal ileum appeared normal.                           The exam was otherwise without abnormality on                            direct and retroflexion views. Complications:            No immediate complications. Estimated Blood Loss:     Estimated blood loss: none. Impression:               - Two 4 to 6 mm polyps in the rectum and in the                            proximal ascending colon, removed with a cold                            snare. Resected and retrieved.                           - Mild sigmoid diverticulosis.                           - Non-bleeding external and internal hemorrhoids.                           - The examined portion of the ileum was normal.                           - The examination was otherwise normal on  direct                            and retroflexion views. Recommendation:           - Patient has a contact number available for                            emergencies. The signs and symptoms of potential                            delayed complications were discussed with the                            patient. Return to normal activities tomorrow.                            Written discharge instructions were provided to the                            patient.                           -  High fiber diet.                           - Continue present medications.                           - Await pathology results.                           - Repeat colonoscopy for surveillance based on                            pathology results.                           - The findings and recommendations were discussed                            with Bobby Ellis.                           - Use Preparation H on as-needed basis if with any                            hemorrhoidal problems. Jackquline Denmark, MD 10/10/2019 9:42:00 AM This report has been signed electronically.

## 2019-10-10 NOTE — Progress Notes (Signed)
VS by SM  No changes in medical or social hx since previsit.  

## 2019-10-10 NOTE — Patient Instructions (Signed)
YOU HAD AN ENDOSCOPIC PROCEDURE TODAY AT [facility]:   Refer to the procedure report that was given to you for any specific questions about what was found during the examination.  If the procedure report does not answer your questions, please call your gastroenterologist to clarify.  If you requested that your care partner not be given the details of your procedure findings, then the procedure report has been included in a sealed envelope for you to review at your convenience later.  YOU SHOULD EXPECT: Some feelings of bloating in the abdomen. Passage of more gas than usual.  Walking can help get rid of the air that was put into your GI tract during the procedure and reduce the bloating. If you had a lower endoscopy (such as a colonoscopy or flexible sigmoidoscopy) you may notice spotting of blood in your stool or on the toilet paper. If you underwent a bowel prep for your procedure, you may not have a normal bowel movement for a few days.  Please Note:  You might notice some irritation and congestion in your nose or some drainage.  This is from the oxygen used during your procedure.  There is no need for concern and it should clear up in a day or so.  SYMPTOMS TO REPORT IMMEDIATELY:   Following lower endoscopy (colonoscopy or flexible sigmoidoscopy):  Excessive amounts of blood in the stool  Significant tenderness or worsening of abdominal pains  Swelling of the abdomen that is new, acute  Fever of 100F or higher   For urgent or emergent issues, a gastroenterologist can be reached at any hour by calling [phone]. Do not use MyChart messaging for urgent concerns.    DIET:  We do recommend a small meal at first, but then you may proceed to your regular diet.  Drink plenty of fluids but you should avoid alcoholic beverages for 24 hours. Follow a High Fiber Diet.  MEDICATIONS: Continue present medications. Use Preparation H on as-needed basis if you have any hemorrhoidal  problems.  Please see handouts given to you by your recovery nurse.  ACTIVITY:  You should plan to take it easy for the rest of today and you should NOT DRIVE or use heavy machinery until tomorrow (because of the sedation medicines used during the test).    FOLLOW UP: Our staff will call the number listed on your records 48-72 hours following your procedure to check on you and address any questions or concerns that you may have regarding the information given to you following your procedure. If we do not reach you, we will leave a message.  We will attempt to reach you two times.  During this call, we will ask if you have developed any symptoms of COVID 19. If you develop any symptoms (ie: fever, flu-like symptoms, shortness of breath, cough etc.) before then, please call [phone].  If you test positive for Covid 19 in the 2 weeks post procedure, please call and report this information to [place].    If any biopsies were taken you will be contacted by phone or by letter within the next 1-3 weeks.  Please call us at [phone] if you have not heard about the biopsies in 3 weeks.   Thank you for allowing [place] to provide for your healthcare needs today.   SIGNATURES/CONFIDENTIALITY: You and/or your care partner have signed paperwork which will be entered into your electronic medical record.  These signatures attest to the fact that that the information  above on your After Visit Summary has been reviewed and is understood.  Full responsibility of the confidentiality of this discharge information lies with you and/or your care-partner. 

## 2019-10-12 ENCOUNTER — Telehealth: Payer: Self-pay

## 2019-10-12 ENCOUNTER — Telehealth: Payer: Self-pay | Admitting: *Deleted

## 2019-10-12 NOTE — Telephone Encounter (Signed)
  Follow up Call-  Call back number 10/10/2019  Post procedure Call Back phone  # [phone]  Permission to leave phone message Yes  Some recent data might be hidden    1st follow up call made.  NALM

## 2019-10-12 NOTE — Telephone Encounter (Signed)
No answer for post procedure call back unable to leave message. 

## 2019-10-21 ENCOUNTER — Encounter: Payer: Self-pay | Admitting: Gastroenterology

## 2020-05-13 DIAGNOSIS — E785 Hyperlipidemia, unspecified: Secondary | ICD-10-CM | POA: Insufficient documentation

## 2020-06-06 ENCOUNTER — Encounter: Payer: Self-pay | Admitting: Cardiology

## 2020-06-06 ENCOUNTER — Other Ambulatory Visit: Payer: Self-pay

## 2020-06-06 ENCOUNTER — Ambulatory Visit (INDEPENDENT_AMBULATORY_CARE_PROVIDER_SITE_OTHER): Payer: 59 | Admitting: Cardiology

## 2020-06-06 VITALS — BP 100/56 | HR 71 | Ht 72.0 in | Wt 167.0 lb

## 2020-06-06 DIAGNOSIS — E785 Hyperlipidemia, unspecified: Secondary | ICD-10-CM | POA: Diagnosis not present

## 2020-06-06 DIAGNOSIS — I110 Hypertensive heart disease with heart failure: Secondary | ICD-10-CM

## 2020-06-06 DIAGNOSIS — I5042 Chronic combined systolic (congestive) and diastolic (congestive) heart failure: Secondary | ICD-10-CM

## 2020-06-06 DIAGNOSIS — I493 Ventricular premature depolarization: Secondary | ICD-10-CM | POA: Diagnosis not present

## 2020-06-06 NOTE — Patient Instructions (Addendum)
Medication Instructions:  Your physician has recommended you make the following change in your medication:  STOP: Candesartan  *If you need a refill on your cardiac medications before your next appointment, please call your pharmacy*   Lab Work: None If you have labs (blood work) drawn today and your tests are completely normal, you will receive your results only by: Marland Kitchen MyChart Message (if you have MyChart) OR . A paper copy in the mail If you have any lab test that is abnormal or we need to change your treatment, we will call you to review the results.   Testing/Procedures: None   Follow-Up: At [facility], you and your health needs are our priority.  As part of our continuing mission to provide you with exceptional heart care, we have created designated Provider Care Teams.  These Care Teams include your primary Cardiologist (physician) and Advanced Practice Providers (APPs -  Physician Assistants and Nurse Practitioners) who all work together to provide you with the care you need, when you need it.  We recommend signing up for the patient portal called "MyChart".  Sign up information is provided on this After Visit Summary.  MyChart is used to connect with patients for Virtual Visits (Telemedicine).  Patients are able to view lab/test results, encounter notes, upcoming appointments, etc.  Non-urgent messages can be sent to your provider as well.   To learn more about what you can do with MyChart, go to [email or web address].    Your next appointment:   1 year(s)  The format for your next appointment:   In Person  Provider:   Shirlee More, MD   Other Instructions Please check your blood pressure at home and only take your telmesartan if your blood pressure is greater than 130 on top.     KardiaMobile [email or web address]        FDA-cleared, clinical grade mobile EKG monitor: Jodelle Red is the most clinically-validated mobile EKG used by the  world's leading cardiac care medical professionals With Basic service, know instantly if your heart rhythm is normal or if atrial fibrillation is detected, and email the last single EKG recording to yourself or your doctor Premium service, available for purchase through the Kardia app for $9.99 per month or $99 per year, includes unlimited history and storage of your EKG recordings, a monthly EKG summary report to share with your doctor, along with the ability to track your blood pressure, activity and weight Includes one KardiaMobile phone clip FREE SHIPPING: Standard delivery 1-3 business days. Orders placed by 11:00am PST will ship that afternoon. Otherwise, will ship next business day. All orders ship via [employer] from [place], [place]

## 2020-06-06 NOTE — Progress Notes (Signed)
Cardiology Office Note:    Date:  06/06/2020   ID:  Bobby Ellis, DOB [birth year], MRN [id number]  PCP:  Nicoletta Dress, MD  Cardiologist:  Shirlee More, MD    Referring MD: Nicoletta Dress, MD    ASSESSMENT:    1. Chronic combined systolic and diastolic heart failure ([place])   2. Hypertensive heart disease with heart failure ([place])   3. PVC (premature ventricular contraction)   4. Hyperlipidemia, unspecified hyperlipidemia type    PLAN:    In order of problems listed above:  1. Stable in retrospect he had hypertensive heart disease with guideline directed therapy his ejection fraction normalized his blood pressure is relatively low will drop out his ARB and continue calcium channel blocker beta-blocker and he will trend blood pressures at home.  If systolics greater than 956 and need to resume an ARB. 2. No evidence of fluid overload asymptomatic no longer on a diuretic 3. Intermittent rare episodes of palpitation he will purchase the iPhone adapter to capture episodes and send them via MyChart 4. Ideal lipids he will continue with statin LDL is at target   Next appointment: 1 year   Medication Adjustments/Labs and Tests Ordered: Current medicines are reviewed at length with the patient today.  Concerns regarding medicines are outlined above.  No orders of the defined types were placed in this encounter.  No orders of the defined types were placed in this encounter.   Chief Complaint  Patient presents with  . Annual Exam    History of Present Illness:    Bobby Ellis is a 65 y.o. male with a hx of dilated cardiomyopathy with heart failure, hypertensive heart disease, frequent PVCs mitral regurgitation and symptomatic hypotension last seen 06/01/2019 his ejection fraction was 49% by gated pool May 2020 echocardiogram in January 2020 showed ejection fraction 55 to 60%.  When last seen his blood pressure is at target and did not require a diuretic. Compliance with diet,  lifestyle and medications: Yes  He checks his blood pressure sporadically at home and says numbers are in range Rarely he has episodes of palpitation. He will purchase the alive cor adapter for his phone to capture and can transmit episodes through [place] He is retired remains very active and vigorous and has no exercise intolerance edema shortness of breath or syncope. Recent labs 03/15/2020: Lipids at target cholesterol 121 LDL 66 triglycerides 67 HDL 41 creatinine 0.92 Past Medical History:  Diagnosis Date  . Anxiety 03/11/2017  . Benign hypertension 03/11/2017  . Cardiomyopathy ([place]) 09/28/2016  . Dilated cardiomyopathy ([place]) 09/28/2016  . Erectile dysfunction 03/11/2017  . External hemorrhoid, thrombosed 03/11/2017  . Heart murmur 03/11/2017  . Hyperlipemia   . Hyperlipidemia 09/30/2016  . Hypogonadism in male 03/11/2017  . Ingrowing nail 12/31/2015  . Mild mitral regurgitation 03/11/2017  . Nail dystrophy 12/31/2015  . Palpitations 03/11/2017  . Panic disorder 03/11/2017  . PVC (premature ventricular contraction) 03/11/2017  . RLS (restless legs syndrome) 03/11/2017    Past Surgical History:  Procedure Laterality Date  . COLONOSCOPY    . HERNIA REPAIR    . POLYPECTOMY      Current Medications: Current Meds  Medication Sig  . ALPRAZolam (XANAX) 0.25 MG tablet Take 0.25 mg by mouth 2 (two) times daily as needed for anxiety.  Marland Kitchen amLODipine (NORVASC) 5 MG tablet Take 5 mg by mouth daily.  . candesartan (ATACAND) 32 MG tablet Take 1 tablet (32 mg total) by mouth daily.  . carvedilol (COREG)  12.5 MG tablet Take 1 tablet (12.5 mg total) by mouth 2 (two) times daily.  . simvastatin (ZOCOR) 20 MG tablet Take 20 mg by mouth every evening.  . testosterone (ANDROGEL) 50 MG/5GM GEL Place 5 g onto the skin daily.  . Testosterone 20.25 MG/ACT (1.62%) GEL Place 4 Pump onto the skin daily.  . traMADol (ULTRAM) 50 MG tablet Take 50 mg by mouth 4 (four) times daily as needed.   Current  Facility-Administered Medications for the 06/06/20 encounter (Office Visit) with Richardo Priest, MD  Medication  . 0.9 %  sodium chloride infusion     Allergies:   Shellfish allergy   Social History   Socioeconomic History  . Marital status: Single    Spouse name: Not on file  . Number of children: Not on file  . Years of education: Not on file  . Highest education level: Not on file  Occupational History  . Not on file  Tobacco Use  . Smoking status: Never Smoker  . Smokeless tobacco: Never Used  Vaping Use  . Vaping Use: Never used  Substance and Sexual Activity  . Alcohol use: Yes    Comment: socially  . Drug use: No  . Sexual activity: Not on file  Other Topics Concern  . Not on file  Social History Narrative  . Not on file   Social Determinants of Health   Financial Resource Strain: Not on file  Food Insecurity: Not on file  Transportation Needs: Not on file  Physical Activity: Not on file  Stress: Not on file  Social Connections: Not on file     Family History: The patient's family history includes Colon polyps in his brother and father; Pancreatic cancer in his mother. There is no history of Colon cancer, Esophageal cancer, Stomach cancer, or Rectal cancer. ROS:   Please see the history of present illness.    All other systems reviewed and are negative.  EKGs/Labs/Other Studies Reviewed:    The following studies were reviewed today:  EKG:  EKG ordered today and personally reviewed.  The ekg ordered today demonstrates sinus rhythm normal EKG    Physical Exam:    VS:  BP 90/60 (BP Location: Right Arm, Patient Position: Sitting, Cuff Size: Normal)   Pulse 71   Ht 6' (1.829 m)   Wt 167 lb (75.8 kg)   SpO2 97%   BMI 22.65 kg/m     Wt Readings from Last 3 Encounters:  06/06/20 167 lb (75.8 kg)  10/10/19 164 lb (74.4 kg)  09/26/19 164 lb (74.4 kg)     GEN:  Well nourished, well developed in no acute distress HEENT: Normal NECK: No JVD; No  carotid bruits LYMPHATICS: No lymphadenopathy CARDIAC: RRR, no murmurs, rubs, gallops RESPIRATORY:  Clear to auscultation without rales, wheezing or rhonchi  ABDOMEN: Soft, non-tender, non-distended MUSCULOSKELETAL:  No edema; No deformity  SKIN: Warm and dry NEUROLOGIC:  Alert and oriented x 3 PSYCHIATRIC:  Normal affect    Signed, Shirlee More, MD  06/06/2020 1:28 PM    [facility]

## 2021-06-12 ENCOUNTER — Encounter: Payer: Self-pay | Admitting: Cardiology

## 2021-06-12 ENCOUNTER — Ambulatory Visit: Payer: Commercial Managed Care - HMO | Admitting: Cardiology

## 2021-06-12 VITALS — BP 120/80 | HR 58 | Ht 72.0 in | Wt 171.2 lb

## 2021-06-12 DIAGNOSIS — I110 Hypertensive heart disease with heart failure: Secondary | ICD-10-CM

## 2021-06-12 DIAGNOSIS — E785 Hyperlipidemia, unspecified: Secondary | ICD-10-CM | POA: Diagnosis not present

## 2021-06-12 NOTE — Progress Notes (Signed)
Cardiology Office Note:    Date:  06/12/2021   ID:  Bobby Ellis, DOB 1955-06-02, MRN [id number]  PCP:  Bobby Dress, MD  Cardiologist:  Bobby More, MD    Referring MD: Bobby Dress, MD    ASSESSMENT:    1. Hypertensive heart disease with heart failure ([place])   2. Hyperlipidemia, unspecified hyperlipidemia type    PLAN:    In order of problems listed above:  Hypertensive heart disease with LV dysfunction blo on current regimen amlodipine carvedilol od pressure is well controlled.  Consider an echocardiogram at his next visit Continue his statin lipids at target   Next appointment: 1 year   Medication Adjustments/Labs and Tests Ordered: Current medicines are reviewed at length with the patient today.  Concerns regarding medicines are outlined above.  Orders Placed This Encounter  Procedures   EKG 12-Lead   No orders of the defined types were placed in this encounter.   Chief Complaint  Patient presents with   Cardiomyopathy    History of Present Illness:    Bobby Ellis is a 66 y.o. male with a hx of dilated cardiomyopathy with heart failure, hypertensive heart disease, frequent PVCs mitral regurgitation and symptomatic hypotension last seen 06/01/2019 his ejection fraction was 49% by gated pool May 2020 echocardiogram in January 2020 showed ejection fraction 55 to 60%. He was last seen 06/06/2020.  Compliance with diet, lifestyle and medications: Yes  He is happily retired for the last 2 years very active and is not having cardiovascular symptoms of exertional shortness of breath edema orthopnea chest pain palpitation or syncope. Recent labs 03/28/2021 cholesterol 155 LDL 85 hemoglobin 12.9 creatinine 1.01 Past Medical History:  Diagnosis Date   Anxiety 03/11/2017   Benign hypertension 03/11/2017   Cardiomyopathy ([place]) 09/28/2016   Dilated cardiomyopathy ([place]) 09/28/2016   Erectile dysfunction 03/11/2017   External hemorrhoid, thrombosed 03/11/2017    Heart murmur 03/11/2017   Hyperlipemia    Hyperlipidemia 09/30/2016   Hypogonadism in male 03/11/2017   Ingrowing nail 12/31/2015   Mild mitral regurgitation 03/11/2017   Nail dystrophy 12/31/2015   Panic disorder 03/11/2017   PVC (premature ventricular contraction) 03/11/2017   RLS (restless legs syndrome) 03/11/2017    Past Surgical History:  Procedure Laterality Date   COLONOSCOPY     HERNIA REPAIR     POLYPECTOMY      Current Medications: Current Meds  Medication Sig   ALPRAZolam (XANAX) 0.25 MG tablet Take 0.25 mg by mouth 2 (two) times daily as needed for anxiety.   amLODipine (NORVASC) 5 MG tablet Take 5 mg by mouth daily.   carvedilol (COREG) 12.5 MG tablet Take 1 tablet (12.5 mg total) by mouth 2 (two) times daily.   simvastatin (ZOCOR) 20 MG tablet Take 20 mg by mouth every evening.   Testosterone 20.25 MG/ACT (1.62%) GEL Place 4 Pump onto the skin daily.   traMADol (ULTRAM) 50 MG tablet Take 50 mg by mouth 4 (four) times daily as needed.   Current Facility-Administered Medications for the 06/12/21 encounter (Office Visit) with Bobby Priest, MD  Medication   0.9 %  sodium chloride infusion     Allergies:   Shellfish allergy and Shellfish-derived products   Social History   Socioeconomic History   Marital status: Single    Spouse name: Not on file   Number of children: Not on file   Years of education: Not on file   Highest education level: Not on file  Occupational History  Not on file  Tobacco Use   Smoking status: Never   Smokeless tobacco: Never  Vaping Use   Vaping Use: Never used  Substance and Sexual Activity   Alcohol use: Yes    Comment: socially   Drug use: No   Sexual activity: Not on file  Other Topics Concern   Not on file  Social History Narrative   Not on file   Social Determinants of Health   Financial Resource Strain: Not on file  Food Insecurity: Not on file  Transportation Needs: Not on file  Physical Activity: Not on  file  Stress: Not on file  Social Connections: Not on file     Family History: Patient can you take out the diagnosis of P RESPIRATORY:  Clear to auscultation without rales, wheezing or rhonchi  ABDOMEN: Soft, non-tender, non-distended MUSCULOSKELETAL:  No edema; No deformity  SKIN: Warm and dry NEUROLOGIC:  Alert and oriented x 3 PSYCHIATRIC:  Normal affect    Signed, Bobby More, MD  06/12/2021 4:41 PM    [facility]

## 2021-06-12 NOTE — Patient Instructions (Signed)

## 2021-10-19 DIAGNOSIS — J069 Acute upper respiratory infection, unspecified: Secondary | ICD-10-CM | POA: Diagnosis not present

## 2021-10-19 DIAGNOSIS — R21 Rash and other nonspecific skin eruption: Secondary | ICD-10-CM | POA: Diagnosis not present

## 2021-11-12 DIAGNOSIS — H11153 Pinguecula, bilateral: Secondary | ICD-10-CM | POA: Diagnosis not present

## 2021-11-12 DIAGNOSIS — H524 Presbyopia: Secondary | ICD-10-CM | POA: Diagnosis not present

## 2021-11-12 DIAGNOSIS — H18413 Arcus senilis, bilateral: Secondary | ICD-10-CM | POA: Diagnosis not present

## 2021-11-12 DIAGNOSIS — H40013 Open angle with borderline findings, low risk, bilateral: Secondary | ICD-10-CM | POA: Diagnosis not present

## 2021-12-07 DIAGNOSIS — Z8679 Personal history of other diseases of the circulatory system: Secondary | ICD-10-CM | POA: Diagnosis not present

## 2021-12-07 DIAGNOSIS — R0789 Other chest pain: Secondary | ICD-10-CM | POA: Diagnosis not present

## 2021-12-07 DIAGNOSIS — R011 Cardiac murmur, unspecified: Secondary | ICD-10-CM | POA: Diagnosis not present

## 2021-12-07 DIAGNOSIS — E782 Mixed hyperlipidemia: Secondary | ICD-10-CM | POA: Diagnosis not present

## 2021-12-15 DIAGNOSIS — R011 Cardiac murmur, unspecified: Secondary | ICD-10-CM | POA: Diagnosis not present

## 2021-12-15 DIAGNOSIS — Z8679 Personal history of other diseases of the circulatory system: Secondary | ICD-10-CM | POA: Diagnosis not present

## 2021-12-18 DIAGNOSIS — E291 Testicular hypofunction: Secondary | ICD-10-CM | POA: Diagnosis not present

## 2021-12-18 DIAGNOSIS — Z0000 Encounter for general adult medical examination without abnormal findings: Secondary | ICD-10-CM | POA: Diagnosis not present

## 2021-12-18 DIAGNOSIS — Z125 Encounter for screening for malignant neoplasm of prostate: Secondary | ICD-10-CM | POA: Diagnosis not present

## 2021-12-18 DIAGNOSIS — E785 Hyperlipidemia, unspecified: Secondary | ICD-10-CM | POA: Diagnosis not present

## 2021-12-22 DIAGNOSIS — J069 Acute upper respiratory infection, unspecified: Secondary | ICD-10-CM | POA: Diagnosis not present

## 2022-03-10 DIAGNOSIS — J019 Acute sinusitis, unspecified: Secondary | ICD-10-CM | POA: Diagnosis not present

## 2022-03-10 DIAGNOSIS — R21 Rash and other nonspecific skin eruption: Secondary | ICD-10-CM | POA: Diagnosis not present

## 2022-03-17 DIAGNOSIS — E785 Hyperlipidemia, unspecified: Secondary | ICD-10-CM | POA: Diagnosis not present

## 2022-03-17 DIAGNOSIS — Z91013 Allergy to seafood: Secondary | ICD-10-CM | POA: Diagnosis not present

## 2022-03-17 DIAGNOSIS — R69 Illness, unspecified: Secondary | ICD-10-CM | POA: Diagnosis not present

## 2022-03-17 DIAGNOSIS — Z833 Family history of diabetes mellitus: Secondary | ICD-10-CM | POA: Diagnosis not present

## 2022-03-17 DIAGNOSIS — Z008 Encounter for other general examination: Secondary | ICD-10-CM | POA: Diagnosis not present

## 2022-03-17 DIAGNOSIS — Z5986 Financial insecurity: Secondary | ICD-10-CM | POA: Diagnosis not present

## 2022-03-17 DIAGNOSIS — I10 Essential (primary) hypertension: Secondary | ICD-10-CM | POA: Diagnosis not present

## 2022-03-17 DIAGNOSIS — N529 Male erectile dysfunction, unspecified: Secondary | ICD-10-CM | POA: Diagnosis not present

## 2022-03-17 DIAGNOSIS — Z800 Family history of malignant neoplasm of digestive organs: Secondary | ICD-10-CM | POA: Diagnosis not present

## 2022-03-17 DIAGNOSIS — Z8249 Family history of ischemic heart disease and other diseases of the circulatory system: Secondary | ICD-10-CM | POA: Diagnosis not present

## 2022-03-23 DIAGNOSIS — N529 Male erectile dysfunction, unspecified: Secondary | ICD-10-CM | POA: Diagnosis not present

## 2022-03-23 DIAGNOSIS — E785 Hyperlipidemia, unspecified: Secondary | ICD-10-CM | POA: Diagnosis not present

## 2022-03-23 DIAGNOSIS — I10 Essential (primary) hypertension: Secondary | ICD-10-CM | POA: Diagnosis not present

## 2022-03-23 DIAGNOSIS — R69 Illness, unspecified: Secondary | ICD-10-CM | POA: Diagnosis not present

## 2022-03-23 DIAGNOSIS — I420 Dilated cardiomyopathy: Secondary | ICD-10-CM | POA: Diagnosis not present

## 2022-03-23 DIAGNOSIS — E291 Testicular hypofunction: Secondary | ICD-10-CM | POA: Diagnosis not present

## 2022-03-23 DIAGNOSIS — Z1331 Encounter for screening for depression: Secondary | ICD-10-CM | POA: Diagnosis not present

## 2022-03-23 DIAGNOSIS — Z6824 Body mass index (BMI) 24.0-24.9, adult: Secondary | ICD-10-CM | POA: Diagnosis not present

## 2022-06-14 DIAGNOSIS — E782 Mixed hyperlipidemia: Secondary | ICD-10-CM | POA: Diagnosis not present

## 2022-06-14 DIAGNOSIS — Z8679 Personal history of other diseases of the circulatory system: Secondary | ICD-10-CM | POA: Diagnosis not present

## 2022-06-14 DIAGNOSIS — I358 Other nonrheumatic aortic valve disorders: Secondary | ICD-10-CM | POA: Diagnosis not present

## 2022-06-14 DIAGNOSIS — I351 Nonrheumatic aortic (valve) insufficiency: Secondary | ICD-10-CM | POA: Diagnosis not present

## 2022-07-01 DIAGNOSIS — I420 Dilated cardiomyopathy: Secondary | ICD-10-CM | POA: Diagnosis not present

## 2022-07-01 DIAGNOSIS — N529 Male erectile dysfunction, unspecified: Secondary | ICD-10-CM | POA: Diagnosis not present

## 2022-07-01 DIAGNOSIS — I10 Essential (primary) hypertension: Secondary | ICD-10-CM | POA: Diagnosis not present

## 2022-07-01 DIAGNOSIS — E785 Hyperlipidemia, unspecified: Secondary | ICD-10-CM | POA: Diagnosis not present

## 2022-07-01 DIAGNOSIS — I358 Other nonrheumatic aortic valve disorders: Secondary | ICD-10-CM | POA: Diagnosis not present

## 2022-07-01 DIAGNOSIS — E291 Testicular hypofunction: Secondary | ICD-10-CM | POA: Diagnosis not present

## 2022-07-01 DIAGNOSIS — I340 Nonrheumatic mitral (valve) insufficiency: Secondary | ICD-10-CM | POA: Diagnosis not present

## 2022-07-01 DIAGNOSIS — F419 Anxiety disorder, unspecified: Secondary | ICD-10-CM | POA: Diagnosis not present

## 2022-09-30 DIAGNOSIS — F419 Anxiety disorder, unspecified: Secondary | ICD-10-CM | POA: Diagnosis not present

## 2022-09-30 DIAGNOSIS — E291 Testicular hypofunction: Secondary | ICD-10-CM | POA: Diagnosis not present

## 2022-09-30 DIAGNOSIS — N529 Male erectile dysfunction, unspecified: Secondary | ICD-10-CM | POA: Diagnosis not present

## 2022-09-30 DIAGNOSIS — E785 Hyperlipidemia, unspecified: Secondary | ICD-10-CM | POA: Diagnosis not present

## 2022-09-30 DIAGNOSIS — I420 Dilated cardiomyopathy: Secondary | ICD-10-CM | POA: Diagnosis not present

## 2022-10-26 DIAGNOSIS — L989 Disorder of the skin and subcutaneous tissue, unspecified: Secondary | ICD-10-CM | POA: Diagnosis not present

## 2022-10-26 DIAGNOSIS — J029 Acute pharyngitis, unspecified: Secondary | ICD-10-CM | POA: Diagnosis not present

## 2022-11-18 DIAGNOSIS — L814 Other melanin hyperpigmentation: Secondary | ICD-10-CM | POA: Diagnosis not present

## 2022-11-18 DIAGNOSIS — D485 Neoplasm of uncertain behavior of skin: Secondary | ICD-10-CM | POA: Diagnosis not present

## 2022-11-18 DIAGNOSIS — L700 Acne vulgaris: Secondary | ICD-10-CM | POA: Diagnosis not present

## 2022-11-18 DIAGNOSIS — D225 Melanocytic nevi of trunk: Secondary | ICD-10-CM | POA: Diagnosis not present

## 2022-11-30 DIAGNOSIS — H524 Presbyopia: Secondary | ICD-10-CM | POA: Diagnosis not present

## 2022-11-30 DIAGNOSIS — H25813 Combined forms of age-related cataract, bilateral: Secondary | ICD-10-CM | POA: Diagnosis not present

## 2022-11-30 DIAGNOSIS — H18413 Arcus senilis, bilateral: Secondary | ICD-10-CM | POA: Diagnosis not present

## 2022-11-30 DIAGNOSIS — H11153 Pinguecula, bilateral: Secondary | ICD-10-CM | POA: Diagnosis not present

## 2022-11-30 DIAGNOSIS — H40013 Open angle with borderline findings, low risk, bilateral: Secondary | ICD-10-CM | POA: Diagnosis not present

## 2022-12-05 DIAGNOSIS — E785 Hyperlipidemia, unspecified: Secondary | ICD-10-CM | POA: Diagnosis not present

## 2022-12-05 DIAGNOSIS — R42 Dizziness and giddiness: Secondary | ICD-10-CM | POA: Diagnosis not present

## 2022-12-05 DIAGNOSIS — R519 Headache, unspecified: Secondary | ICD-10-CM | POA: Diagnosis not present

## 2022-12-05 DIAGNOSIS — H8110 Benign paroxysmal vertigo, unspecified ear: Secondary | ICD-10-CM | POA: Diagnosis not present

## 2022-12-05 DIAGNOSIS — I509 Heart failure, unspecified: Secondary | ICD-10-CM | POA: Diagnosis not present

## 2022-12-05 DIAGNOSIS — I110 Hypertensive heart disease with heart failure: Secondary | ICD-10-CM | POA: Diagnosis not present

## 2022-12-16 DIAGNOSIS — I358 Other nonrheumatic aortic valve disorders: Secondary | ICD-10-CM | POA: Diagnosis not present

## 2022-12-18 DIAGNOSIS — Z0000 Encounter for general adult medical examination without abnormal findings: Secondary | ICD-10-CM | POA: Diagnosis not present

## 2022-12-18 DIAGNOSIS — Z6822 Body mass index (BMI) 22.0-22.9, adult: Secondary | ICD-10-CM | POA: Diagnosis not present

## 2022-12-20 DIAGNOSIS — I358 Other nonrheumatic aortic valve disorders: Secondary | ICD-10-CM | POA: Diagnosis not present

## 2022-12-20 DIAGNOSIS — I351 Nonrheumatic aortic (valve) insufficiency: Secondary | ICD-10-CM | POA: Diagnosis not present

## 2022-12-20 DIAGNOSIS — E782 Mixed hyperlipidemia: Secondary | ICD-10-CM | POA: Diagnosis not present

## 2022-12-20 DIAGNOSIS — Z8679 Personal history of other diseases of the circulatory system: Secondary | ICD-10-CM | POA: Diagnosis not present

## 2022-12-21 DIAGNOSIS — B9689 Other specified bacterial agents as the cause of diseases classified elsewhere: Secondary | ICD-10-CM | POA: Diagnosis not present

## 2022-12-21 DIAGNOSIS — J019 Acute sinusitis, unspecified: Secondary | ICD-10-CM | POA: Diagnosis not present

## 2022-12-31 DIAGNOSIS — H8110 Benign paroxysmal vertigo, unspecified ear: Secondary | ICD-10-CM | POA: Diagnosis not present

## 2023-01-11 DIAGNOSIS — H8113 Benign paroxysmal vertigo, bilateral: Secondary | ICD-10-CM | POA: Diagnosis not present

## 2023-01-13 DIAGNOSIS — R42 Dizziness and giddiness: Secondary | ICD-10-CM | POA: Diagnosis not present

## 2023-01-13 DIAGNOSIS — H8110 Benign paroxysmal vertigo, unspecified ear: Secondary | ICD-10-CM | POA: Diagnosis not present

## 2023-01-20 DIAGNOSIS — R42 Dizziness and giddiness: Secondary | ICD-10-CM | POA: Diagnosis not present

## 2023-01-20 DIAGNOSIS — H8110 Benign paroxysmal vertigo, unspecified ear: Secondary | ICD-10-CM | POA: Diagnosis not present

## 2023-02-01 DIAGNOSIS — Z9181 History of falling: Secondary | ICD-10-CM | POA: Diagnosis not present

## 2023-02-01 DIAGNOSIS — Z0000 Encounter for general adult medical examination without abnormal findings: Secondary | ICD-10-CM | POA: Diagnosis not present

## 2023-02-03 DIAGNOSIS — H8110 Benign paroxysmal vertigo, unspecified ear: Secondary | ICD-10-CM | POA: Diagnosis not present

## 2023-02-03 DIAGNOSIS — R42 Dizziness and giddiness: Secondary | ICD-10-CM | POA: Diagnosis not present

## 2023-02-17 DIAGNOSIS — R42 Dizziness and giddiness: Secondary | ICD-10-CM | POA: Diagnosis not present

## 2023-02-17 DIAGNOSIS — H8110 Benign paroxysmal vertigo, unspecified ear: Secondary | ICD-10-CM | POA: Diagnosis not present

## 2023-02-25 DIAGNOSIS — J069 Acute upper respiratory infection, unspecified: Secondary | ICD-10-CM | POA: Diagnosis not present

## 2023-03-12 DIAGNOSIS — Z6822 Body mass index (BMI) 22.0-22.9, adult: Secondary | ICD-10-CM | POA: Diagnosis not present

## 2023-03-12 DIAGNOSIS — E785 Hyperlipidemia, unspecified: Secondary | ICD-10-CM | POA: Diagnosis not present

## 2023-03-12 DIAGNOSIS — I420 Dilated cardiomyopathy: Secondary | ICD-10-CM | POA: Diagnosis not present

## 2023-03-12 DIAGNOSIS — I10 Essential (primary) hypertension: Secondary | ICD-10-CM | POA: Diagnosis not present

## 2023-05-17 DIAGNOSIS — H40013 Open angle with borderline findings, low risk, bilateral: Secondary | ICD-10-CM | POA: Diagnosis not present

## 2023-05-17 DIAGNOSIS — H25813 Combined forms of age-related cataract, bilateral: Secondary | ICD-10-CM | POA: Diagnosis not present

## 2023-06-16 DIAGNOSIS — E785 Hyperlipidemia, unspecified: Secondary | ICD-10-CM | POA: Diagnosis not present

## 2023-06-16 DIAGNOSIS — M159 Polyosteoarthritis, unspecified: Secondary | ICD-10-CM | POA: Diagnosis not present

## 2023-06-16 DIAGNOSIS — I10 Essential (primary) hypertension: Secondary | ICD-10-CM | POA: Diagnosis not present

## 2023-06-16 DIAGNOSIS — I420 Dilated cardiomyopathy: Secondary | ICD-10-CM | POA: Diagnosis not present

## 2023-08-05 DIAGNOSIS — I358 Other nonrheumatic aortic valve disorders: Secondary | ICD-10-CM | POA: Diagnosis not present

## 2023-08-05 DIAGNOSIS — I351 Nonrheumatic aortic (valve) insufficiency: Secondary | ICD-10-CM | POA: Diagnosis not present

## 2023-08-05 DIAGNOSIS — Z8679 Personal history of other diseases of the circulatory system: Secondary | ICD-10-CM | POA: Diagnosis not present

## 2023-08-05 DIAGNOSIS — E782 Mixed hyperlipidemia: Secondary | ICD-10-CM | POA: Diagnosis not present

## 2023-10-04 DIAGNOSIS — I420 Dilated cardiomyopathy: Secondary | ICD-10-CM | POA: Diagnosis not present

## 2023-10-04 DIAGNOSIS — I10 Essential (primary) hypertension: Secondary | ICD-10-CM | POA: Diagnosis not present

## 2023-10-04 DIAGNOSIS — M159 Polyosteoarthritis, unspecified: Secondary | ICD-10-CM | POA: Diagnosis not present

## 2023-10-04 DIAGNOSIS — Z23 Encounter for immunization: Secondary | ICD-10-CM | POA: Diagnosis not present

## 2023-10-04 DIAGNOSIS — E785 Hyperlipidemia, unspecified: Secondary | ICD-10-CM | POA: Diagnosis not present
